# Patient Record
Sex: Male | Born: 1945 | Race: White | Hispanic: No | Marital: Married | Smoking: Former smoker
Health system: Southern US, Community
[De-identification: ages and names within clinical notes are randomized; demographics above are authoritative.]

## PROBLEM LIST (undated history)

## (undated) DIAGNOSIS — I10 Essential (primary) hypertension: Secondary | ICD-10-CM

## (undated) DIAGNOSIS — E669 Obesity, unspecified: Secondary | ICD-10-CM

## (undated) DIAGNOSIS — I2510 Atherosclerotic heart disease of native coronary artery without angina pectoris: Secondary | ICD-10-CM

## (undated) DIAGNOSIS — R7302 Impaired glucose tolerance (oral): Secondary | ICD-10-CM

## (undated) DIAGNOSIS — E785 Hyperlipidemia, unspecified: Secondary | ICD-10-CM

## (undated) HISTORY — PX: CARDIAC CATHETERIZATION: SHX172

## (undated) HISTORY — PX: OTHER SURGICAL HISTORY: SHX169

## (undated) HISTORY — DX: Obesity, unspecified: E66.9

## (undated) HISTORY — DX: Atherosclerotic heart disease of native coronary artery without angina pectoris: I25.10

## (undated) HISTORY — DX: Essential (primary) hypertension: I10

## (undated) HISTORY — DX: Impaired glucose tolerance (oral): R73.02

## (undated) HISTORY — DX: Hyperlipidemia, unspecified: E78.5

---

## 1997-08-24 ENCOUNTER — Ambulatory Visit (HOSPITAL_COMMUNITY): Admission: RE | Admit: 1997-08-24 | Discharge: 1997-08-24 | Payer: Self-pay | Admitting: Gastroenterology

## 1997-12-29 ENCOUNTER — Inpatient Hospital Stay (HOSPITAL_COMMUNITY): Admission: EM | Admit: 1997-12-29 | Discharge: 1997-12-31 | Payer: Self-pay | Admitting: Emergency Medicine

## 1998-01-10 ENCOUNTER — Inpatient Hospital Stay (HOSPITAL_COMMUNITY): Admission: EM | Admit: 1998-01-10 | Discharge: 1998-01-11 | Payer: Self-pay | Admitting: *Deleted

## 1998-09-29 ENCOUNTER — Encounter: Admission: RE | Admit: 1998-09-29 | Discharge: 1998-10-29 | Payer: Self-pay | Admitting: Family Medicine

## 1999-03-19 ENCOUNTER — Emergency Department (HOSPITAL_COMMUNITY): Admission: EM | Admit: 1999-03-19 | Discharge: 1999-03-19 | Payer: Self-pay | Admitting: Emergency Medicine

## 1999-10-26 ENCOUNTER — Encounter: Payer: Self-pay | Admitting: Emergency Medicine

## 1999-10-27 ENCOUNTER — Observation Stay (HOSPITAL_COMMUNITY): Admission: EM | Admit: 1999-10-27 | Discharge: 1999-10-27 | Payer: Self-pay | Admitting: Emergency Medicine

## 2000-12-25 ENCOUNTER — Inpatient Hospital Stay (HOSPITAL_COMMUNITY): Admission: EM | Admit: 2000-12-25 | Discharge: 2000-12-26 | Payer: Self-pay | Admitting: Podiatry

## 2000-12-25 ENCOUNTER — Encounter: Payer: Self-pay | Admitting: *Deleted

## 2001-04-01 ENCOUNTER — Emergency Department (HOSPITAL_COMMUNITY): Admission: EM | Admit: 2001-04-01 | Discharge: 2001-04-02 | Payer: Self-pay | Admitting: Emergency Medicine

## 2001-04-02 ENCOUNTER — Encounter: Payer: Self-pay | Admitting: Emergency Medicine

## 2001-05-13 ENCOUNTER — Emergency Department (HOSPITAL_COMMUNITY): Admission: EM | Admit: 2001-05-13 | Discharge: 2001-05-13 | Payer: Self-pay | Admitting: Family Medicine

## 2001-07-08 ENCOUNTER — Ambulatory Visit (HOSPITAL_COMMUNITY): Admission: RE | Admit: 2001-07-08 | Discharge: 2001-07-08 | Payer: Self-pay | Admitting: Orthopedic Surgery

## 2001-11-05 ENCOUNTER — Encounter: Admission: RE | Admit: 2001-11-05 | Discharge: 2002-02-03 | Payer: Self-pay | Admitting: Orthopedic Surgery

## 2002-08-27 ENCOUNTER — Encounter: Payer: Self-pay | Admitting: Orthopedic Surgery

## 2002-09-03 ENCOUNTER — Inpatient Hospital Stay (HOSPITAL_COMMUNITY): Admission: RE | Admit: 2002-09-03 | Discharge: 2002-09-04 | Payer: Self-pay | Admitting: Orthopedic Surgery

## 2002-09-03 ENCOUNTER — Encounter: Payer: Self-pay | Admitting: Orthopedic Surgery

## 2002-10-01 ENCOUNTER — Encounter: Payer: Self-pay | Admitting: Emergency Medicine

## 2002-10-01 ENCOUNTER — Emergency Department (HOSPITAL_COMMUNITY): Admission: EM | Admit: 2002-10-01 | Discharge: 2002-10-01 | Payer: Self-pay

## 2002-10-03 ENCOUNTER — Emergency Department (HOSPITAL_COMMUNITY): Admission: EM | Admit: 2002-10-03 | Discharge: 2002-10-03 | Payer: Self-pay | Admitting: Emergency Medicine

## 2002-10-03 ENCOUNTER — Encounter: Payer: Self-pay | Admitting: Emergency Medicine

## 2003-05-14 ENCOUNTER — Encounter: Admission: RE | Admit: 2003-05-14 | Discharge: 2003-05-14 | Payer: Self-pay | Admitting: Orthopedic Surgery

## 2003-07-24 ENCOUNTER — Inpatient Hospital Stay (HOSPITAL_COMMUNITY): Admission: RE | Admit: 2003-07-24 | Discharge: 2003-07-30 | Payer: Self-pay | Admitting: Orthopedic Surgery

## 2003-08-02 ENCOUNTER — Emergency Department (HOSPITAL_COMMUNITY): Admission: EM | Admit: 2003-08-02 | Discharge: 2003-08-03 | Payer: Self-pay | Admitting: Emergency Medicine

## 2003-08-23 ENCOUNTER — Emergency Department (HOSPITAL_COMMUNITY): Admission: EM | Admit: 2003-08-23 | Discharge: 2003-08-23 | Payer: Self-pay | Admitting: Emergency Medicine

## 2005-09-13 ENCOUNTER — Encounter: Payer: Self-pay | Admitting: *Deleted

## 2007-05-12 ENCOUNTER — Emergency Department (HOSPITAL_COMMUNITY): Admission: EM | Admit: 2007-05-12 | Discharge: 2007-05-12 | Payer: Self-pay | Admitting: Emergency Medicine

## 2007-10-07 ENCOUNTER — Emergency Department (HOSPITAL_COMMUNITY): Admission: EM | Admit: 2007-10-07 | Discharge: 2007-10-08 | Payer: Self-pay | Admitting: Emergency Medicine

## 2010-07-20 ENCOUNTER — Ambulatory Visit (INDEPENDENT_AMBULATORY_CARE_PROVIDER_SITE_OTHER): Payer: MEDICARE | Admitting: Nurse Practitioner

## 2010-07-20 DIAGNOSIS — E669 Obesity, unspecified: Secondary | ICD-10-CM

## 2010-07-20 DIAGNOSIS — I10 Essential (primary) hypertension: Secondary | ICD-10-CM

## 2010-07-20 DIAGNOSIS — I2510 Atherosclerotic heart disease of native coronary artery without angina pectoris: Secondary | ICD-10-CM

## 2010-09-30 NOTE — Op Note (Signed)
NAME:  Richard Reid, Richard Reid                         ACCOUNT NO.:  [id number]   MEDICAL RECORD NO.:  [id number]                   PATIENT TYPE:  INP   LOCATION:  B147                                 FACILITY:  [facility]   PHYSICIAN:  Marlowe Kays, M.D.               DATE OF BIRTH:  10-09-1945   DATE OF PROCEDURE:  DATE OF DISCHARGE:                                 OPERATIVE REPORT   PREOPERATIVE DIAGNOSIS:  Painful medial unicondylar arthroplasty, left knee.   POSTOPERATIVE DIAGNOSIS:  Painful unicondylar medial arthroplasty, left knee  secondary to loosening of femoral component.   OPERATION:  Revision of medial unicompartmental arthroplasty left knee to an  [employer] [place] posterior cruciate sacrificing totally cemented knee  replacement.   SURGEON:  Marlowe Kays, M.D.   ASSISTANT:  Almedia Balls. Ranell Patrick, M.D.   ANESTHESIA:  General.   INDICATIONS FOR PROCEDURE:  He had his original knee replacement performed  on September 03, 2002 and initially did quite well.  He has had progressive pain  in the inner aspect of his left knee with a negative workup with good-  looking x-rays.  No evidence for infection; however, in surgery, he was  found to have a loose femoral component.   PROCEDURE:  Prophylactic antibiotics, satisfactory general anesthesia, Foley  catheter inserted.  Pneumatic tourniquet.  Surefoot lateral hip positioner.  The left leg was prepped with Duraprep from tourniquet to ankle and draped  in a sterile field.  I extended the previous medial parapatellar incision  proximally and distally and with median and parapatellar incision, opened  the joint.  Checked the patellar mechanism.  Despite freeing it up as best  we could, it was quite tight.  I used the 45 degree quadriceps snip.  Even  with this, for the initial portion of the case, I just had to simply move  the patellar lateralward, but we were eventually able to ever the patella as  more bone was resected.  After  removing the ACL and most of the PCL, I found  the femoral component to be loose and easily removed it.  Then we removed  the tibial component as well without difficulty.  Then placed a 16 inch  drill hole in the distal femur by the canal finder, and the axis liner was  set for a 5 degree Valgus cut to the left knee.  I elected to remove 12 mm  of bone off the distal femur because it did have a slight flexion  contracture.  The distal femoral cut was made.  I then made a leveling cut  on the proximal tibia to allow room for the cradle for determining distal  femoral size to be placed.  The size was determined as a #9, scribe lines  were placed on the distal cut surface of the femur, and I then used the  distal femoral cutting jig, size 9, to make anterior and posterior  cuts and  posterior and anterior chamferings.  I then returned to the tibia, where a  base plate was placed, sizing it as a 9 and an initial intramedullary drill  hole was made followed by the canal finder, and the internal rub was  attached to the cutting jig for a 5 degree posterior cut, set for 2 mm of  the depressed medial tibial.  We lined this up with the external rods  splitting the bimalleolar distance.  A 2-mm cut was made.  I then used a  laminar spreader and removed remnants of bone and soft tissue posteriorly.  I placed a patellar guide for creating the patellar groove.  I followed this  with the cutter and punch for creating the hole for the post.  I used the  micro saw to remove the bone initially.  I then went through a trial  reduction and finally had adequate bone resection for a minimum of a 10 mm  spacer.  I used the tibial tray and external liner, splitting the  bimalleolar distance to mark the scribe lines on the anterior tibia.  With  knee in extension, I then measured the patella to a 26 and used the 10 mm  recessed cutting jig to make the initial cut, and I placed the guide for  making the three  fixation drill holes, which were placed.  The trial  patellar butt was then placed, and excess bone was trimmed up around the  patellar button.  I then returned to the tibia and placed the base plate in  a previously determined position, which was stabilized with three pins.  I  then used the tripod apparatus to ream for the tibial keel of 2 and 9.  With  the preparation completed, the wound was water-picked while the  methylmethacrylate was being mixed.  The three components were then  individually glued in and packing each one, removing excess  methylmethacrylate.  When the tibia and femur had been complete held in  extension while the patella was placed and held with a patellar clamp.  When  the methacrylate had hardened, the remnants of the methacrylate were trimmed  up from around the components and were checked posteriorly, in particular.  Then went through a number of  trial reductions, working up to a 15, which  gave [place] the best combination of 0 extension and flexion stability.  We did  have to perform a lateral release to insure a better patellar tracking.   The final size 15 mm posterior stabilized insert was then placed and range  of motion intact, incidentally checked and found to be good.  I then closed  the wound over a Hemovac.  At this point, it was two hours of tourniquet  time, and the tourniquet was released.  The quadriceps snip was prepared  with #1 fiber wire, and the regular quadriceps incision.  The __________  distally was closed in two layers with interrupted #1 Vicryl.  Subcutaneous  tissue was closed with a combination of #1 2-0 Vicryl and the skin with  staples.  Betadine Adaptic dry sterile dressing and knee immobilizer were  applied.  He tolerated the procedure well and was stable in satisfactory  condition with no other complications.  Estimated blood loss was less than  100 cc.  No blood replacement.  Marlowe Kays, M.D.    JA/MEDQ  D:  07/24/2003  T:  07/24/2003  Job:  [id number]

## 2010-09-30 NOTE — Cardiovascular Report (Signed)
[place]. [facility]  Patient:    Richard Reid, Richard Reid                        MRN: [id number] Proc. Date: 10/27/99 Attending:  Peter M. [place]il[place], M.D. CC:         Abran Cantor. Clovis Riley, MD             Cardiac Catheterization Laboratory                        Cardiac Catheterization  INDICATION FOR PROCEDURE:  The patient is a 65 year old male with a history of obesity and hypercholesterolemia who presents with chest pain consistent with previous anginal attack.  The patient is status post stenting in the mid LAD in August of 1999.  ACCESS:  Via the right femoral artery using the standard Seldinger technique.  EQUIPMENT:  A 6 French 4 cm right and left Judkins catheter, 6 French pigtail catheter, 6 French arterial sheath.  MEDICATIONS:  Local anesthesia with 1% Xylocaine.  CONTRAST:  Omnipaque 130 cc.  HEMODYNAMIC DATA:  Aortic pressures 131/88 with a mean of 107.  Left ventricular pressures 115 with an EDP of 23 mmHg.  ANGIOGRAPHIC DATA:  Left coronary artery:  The left coronary artery arises and distributes normally.  The patient has very large coronaries that are diffusely ectatic.  Left main:  The left main coronary artery is normal.  Left anterior descending:  The left anterior descending artery is a very large vessel.  There is a stent visible in the mid vessel which appears to have a mild 20-30% narrowing but no focal obstructive disease.  In the distal LAD as is wraps around the apex, there is a 40-50% narrowing.  There is a very small intermediate vessel which has an 80% stenosis proximally.  Left circumflex:  The left circumflex coronary artery is a large vessel without significant disease giving rise to two large marginal vessels.  Right coronary artery:  The right coronary artery is a large dominant vessel. There is approximately 20% disease in the PDA and posterolateral branches.  LEFT VENTRICULAR ANGIOGRAPHY:  The left ventricular angiography  is performed in the RAO view.  This demonstrates normal left ventricular chamber size and contractility with normal systolic function.  Ejection fraction is estimated at 60%.  There is no significant mitral valve prolapse or regurgitation.  FINAL INTERPRETATION: 1. Atherosclerotic coronary artery disease.    a. Obstructive lesion in a small intermediate vessel.  This vessel appears       too small to warrant intervention.    b. Continued patency of the previously stented site in the left anterior       descending artery. 2. Normal left ventricular function.  PLAN:  Will continue medical treatment and risk factor modification. DD:  10/27/99 TD:  11/01/99 Job: [id number]

## 2010-09-30 NOTE — Cardiovascular Report (Signed)
[place]. [facility]  Patient:    Richard Reid, Richard Reid                      MRN: [id number] Proc. Date: 12/25/00 Adm. Date:  [id number] Attending:  [place], Peter Manning CC:         Abran Cantor. Clovis Riley, M.D.   Cardiac Catheterization  INDICATIONS FOR PROCEDURE:  The patient is a 65 year old, white male with a history of hypercholesterolemia and prior stent to the mid LAD, who presents with unstable angina.  ACCESS:  Via the right femoral artery using standard Seldinger technique.  EQUIPMENT:  A 6 French 4 cm right and left Judkins catheter, 6 French pigtail catheter, 6 French arterial sheath.  MEDICATIONS:  Local anesthesia with 1% Xylocaine.  CONTRAST:  Omnipaque 190 cc.  HEMODYNAMIC DATA:  Aortic pressure is 118/82 with a mean of 100.  Left ventricular pressure is 112 with an EDP of 28 mmHg.  ANGIOGRAPHIC DATA:  Left coronary artery:  The left coronary artery arises and distributes normally.  Left main:  The left main coronary artery is large without significant disease.  Left anterior descending:  The left anterior descending artery is diffusely ectatic.  In the mid vessel the stent is noted and appears to be widely patent with less than 20% residual stenosis.  The distal LAD as it begins to wrap around the apex demonstrates a 95% focal stenosis.  There is a very small intermediate vessel which has a 95% stenosis at its origin.  Left circumflex:  The left circumflex coronary is a large vessel which has only minor wall irregularities, less than 20%.  Right coronary artery:  The right coronary artery is a large dominant vessel. It has minor irregularities in the distal vessel, less than 20%. It gives rise to a PDA and one large posterolateral branch.  LEFT VENTRICULAR ANGIOGRAPHY:  The left ventricular angiography performed in the RAO and LAO cranial views demonstrates normal left ventricular size and contractility with normal systolic function.   Ejection fraction is estimated at 65%.  No segmental wall motion abnormalities are seen.  IMPRESSION: 1. Two-vessel obstructive atherosclerotic coronary artery disease    involving a very small intermediate branch in the distal left anterior    descending. 2. Continued long-term patency of the prior stent in the mid left anterior    descending. 3. Normal left ventricular function.  PLAN:  The intermediate vessel appears to small for intervention.  I would recommend medical therapy at this point.  If the patient has refractory angina on medical therapy would consider angioplasty of the distal LAD. DD:  12/25/00 TD:  12/25/00 Job: [id number]

## 2010-09-30 NOTE — Op Note (Signed)
[facility]  Patient:    Richard Reid, Richard Reid Visit Number: [id number] MRN: [id number]          Service Type: DSU Location: DAY Attending Physician:  Marlowe Kays Page Dictated by:   Illene Labrador. Aplington, M.D. Proc. Date: 07/08/01 Admit Date:  07/08/2001                             Operative Report  PREOPERATIVE DIAGNOSES:  1. Torn medial meniscus.  2. Chondromalacia of the medial femoral condyle, left knee.  POSTOPERATIVE DIAGNOSES:  1. Torn medial and lateral menisci.  2. Medial compartment arthritis, left knee.  OPERATION PERFORMED:  Left knee arthroscopy with 1) partial, medial, and lateral meniscectomies, 2) shaving and debridement of the medial femoral condyle.  SURGEON:  Illene Labrador. Aplington, M.D.  ASSISTANT:  Nurse.  ANESTHESIA:  General.  PATHOLOGY AND JUSTIFICATION FOR PROCEDURE:  The problems began with a ______ on April 01, 2001. He has had persistent pain in his left knee leading to an MRI on June 12, 2001 which demonstrated a complex tear of the posterior horn of the medial meniscus especially laterally and some chondral defects in both the medial femoral condyle and the medial tibial plateau. This is basically confirmed at surgery. We also had intercondylar tear of the lateral meniscus as well.  DESCRIPTION OF PROCEDURE:  Satisfactory general anesthesia, pneumatic tourniquet, thigh stabilizer, left knee was prepped with Duraprep, draped in a sterile field, Ace wrap to right leg. First through an anterolateral portal, the medial compartment of the knee joint was evaluated. He had a good bit of synovitis in the knee joint which I resected with a 3.5 shaver. He had a little deformity of the anterior third of the medial meniscus which I also debrided up. Throughout extensive chondromalacia of the medial femoral condyle, I handled with a combination of debridement with baskets, smoothing down with rasps and ultimately shaving  down with a 3.5 shaver. The chondromalacia of the medial tibial plateau did not require any surgical treatment. He did have partial detachment of the medial meniscus on the underneath surface near the posterior attachment of the meniscus to the synovium involving basically the entire posterior third. Several representative pictures were taken. I managed this by resecting this back to stable rim with baskets and shaving until smooth with 3.5 shaver leaving a nice taper at the junction of the mid and posterior third. His ACL was intact. Looking up in the medial gutter and suprapatellar area, there was wear of his patella but nothing that required surgical intervention. I then reversed portals ______ he had a little bit of synovitis present. There was some irregularity to the inner border of the lateral meniscus but no frank tear except in the intercondylar area. There was a little bit of wear of the lateral tibial plateau. All of this pictured. I debrided back the intercondylar attachment of the lateral meniscus with baskets and then shaved it down until smooth with a 3.5 shaver. The knee joint was then irrigated until clear and all fluid possible removed. The two anterior portals were closed with 4-0 nylon, 20 cc of 0.5% Marcaine with adrenaline and 4 mg of morphine were then instilled through the inflow apparatus which was removed and this portal closed with 4-0 nylon as well. Betadine Adaptic dry sterile dressing were applied. The tourniquet was released. He tolerated the procedure well and at the time of this dictation was on his way  to the recovery room in satisfactory condition with no known complications. Dictated by:   Illene Labrador. Aplington, M.D. Attending Physician:  Joaquin Courts DD:  07/08/01 TD:  07/08/01 Job: [id number]

## 2010-09-30 NOTE — H&P (Signed)
[place]. [facility]  Patient:    Richard Reid, Richard Reid                      MRN: [id number] Adm. Date:  [id number] Attending:  [place], Peter Manning CC:         Abran Cantor. Clovis Riley, M.D.             Peter M. [place], M.D.                         History and Physical  HISTORY OF PRESENT ILLNESS:  This is a 65 year old Caucasian gentleman admitted with substernal chest pain.  He has a history of known coronary artery disease.  He presented in August 1999 with substernal pain similar to tonight and underwent cardiac catheterization and underwent stenting to a tight LAD lesion.  He was readmitted briefly a day or two later for observation and was felt to be musculoskeletal pain post catheterization.  He has had no subsequent problems until last night, while driving to work he felt nervous and had a feeling that his blood pressure might be up so that he turned around and came home and rested and took some aspirin.  Tonight while sitting in a chair, he developed substernal chest tightness without diaphoresis or nausea or vomiting but the pain was quite similar to what he experienced in 1999, so his wife drove him to the emergency room where he was evaluated and admitted. The patient admits to being very noncompliant with medications.  He has not been taking any beta blocker or aspirin. He has not been on any cholesterol medications.  He sees his physicians only infrequently.  He is not a known diabetic but does have a long history of labile blood pressure.  FAMILY HISTORY:  His father is living but has a history of heart trouble, diabetes and heart failure. Mother is living and has a history of high blood pressure.  SOCIAL HISTORY: Reveals that he works at a [employer] on the night shift. He runs a fork lift.  He is a nonsmoker but does dip snuff.  He denies any alcohol ingestion.  SOCIAL HISTORY: Reveals that he is married.  He has a 49-year-old child as well as  two grown daughters from a previous marriage. His present wife was 76 when they had their child.  PAST MEDICAL HISTORY:  Reveals that he has had rupture disc surgery by Dr. Jordan Likes for cervical disc disease.  He has also had a mass in the left breast removed about ten years ago which was benign.  ALLERGIES: No known drug allergies.  REVIEW OF SYSTEMS:  Gastrointestinal: Negative. Genitourinary:  Negative except for frequent nocturia.  He does not have any history of known diabetes or thyroid trouble.  PHYSICAL EXAMINATION:  VITAL SIGNS:  His blood pressure is 110/65, pulse 80 and regular, respirations normal. Weight is estimated at 283.  GENERAL:  Reveals that this is a pleasant bearded gentleman in no acute distress.  He has IV nitroglycerin and IV heparin running.  He is not having any chest pain at the present time.  CHEST: Clear to percussion and auscultation.  HEART: Reveals no murmur, gallop, rub or click.  ABDOMEN: Obese and nontender.  EXTREMITIES:  Show excellent pedal pulses.  Chest x-ray shows normal heart size and clear lungs.  EKG shows normal sinus rhythm and no acute changes. Laboratory studies are pending but the i-STAT is satisfactory.  DIAGNOSTIC IMPRESSION: 1. Chest pain rule out myocardial infarction, rule out unstable angina. 2. Known coronary artery disease status post stent to left anterior descending    in 1999. 3. Exogenous obesity.  DISPOSITION: We are going to treat him with IV nitroglycerin and IV heparin. Will add oral Lopressor and aspirin.  Serial enzymes will be obtained. Will also check fasting lipids.  Anticipate probable cardiac catheterization tomorrow by Dr. [place]and so we will keep npo until Dr. Swaziland sees him. DD:  10/27/99 TD:  10/27/99 Job: [id number]

## 2010-09-30 NOTE — Consult Note (Signed)
NAME:  Richard Reid, Richard Reid                         ACCOUNT NO.:  [id number]   MEDICAL RECORD NO.:  [id number]                   PATIENT TYPE:  INP   LOCATION:  0457                                 FACILITY:  [facility]   PHYSICIAN:  Rozanna Boer., M.D.      DATE OF BIRTH:  December 28, 1945   DATE OF CONSULTATION:  07/29/2003  DATE OF DISCHARGE:                                   CONSULTATION   REASON FOR CONSULTATION:  Inability to cath patient.   REFERRED BY:  Marlowe Kays, M.D.   BRIEF HISTORY:  This 65 year old patient had a left knee operation on July 24, 2003.  He has been unable to void since then and had no previous  difficulty voiding by history.  The catheter came out this morning and he  has voided very little on his own, if any, and has had two previous in and  out catheterizations but the third one attempted by the nurses this evening  was unsuccessful. They were unable to get into the bladder.  Urological  consultation was then requested.   The patient apparently is still on Flomax, Proscar and bethanechol.  He is  not very ambulatory yet from his knee. He was not particularly in discomfort  but has not voided all day.   The patient was helped back into his bed with some difficulty and with the  patient in the supine position, I was able to prep and drape the patient and  insert a #16 Foley catheter without difficulty.  He had about 300 mL of  clear urine, Foley was left to straight drainage.   IMPRESSION:  Urinary retention post left knee operation. Recommend continue  Flomax, Proscar and bethanechol as he is on and I would just leave the  catheter for a few more days until he is more ambulatory and then repeat  voiding trial at that time.  Dr. Logan Bores will continue to follow the patient.                                               Rozanna Boer., M.D.    HMK/MEDQ  D:  07/29/2003  T:  08/01/2003  Job:  [id number]

## 2010-09-30 NOTE — Op Note (Signed)
NAME:  Richard Reid, Richard Reid                         ACCOUNT NO.:  [id number]   MEDICAL RECORD NO.:  [id number]                   PATIENT TYPE:  INP   LOCATION:  0479                                 FACILITY:  [facility]   PHYSICIAN:  Marlowe Kays, M.D.               DATE OF BIRTH:  Mar 10, 1946   DATE OF PROCEDURE:  09/03/2002  DATE OF DISCHARGE:                                 OPERATIVE REPORT   PREOPERATIVE DIAGNOSIS:  Medial compartment degenerative and traumatic  arthritis, left knee.   POSTOPERATIVE DIAGNOSIS:  Medial compartment degenerative and traumatic  arthritis, left knee.   OPERATION:  1. Partial patellectomy, medial facet patella.  2. Osteonics medial uni-compartment arthroplasty, left knee.   SURGEON:  Marlowe Kays, M.D.   ASSISTANT:  Georges Lynch. Darrelyn Hillock, M.D.   ANESTHESIA:  General.   INDICATIONS FOR PROCEDURE:  He has genu varum bilaterally on the left knee.  He has had a torn medial meniscus followed by an arthroscopy and debridement  of medial joint with incomplete release of pain.  I have used the ______  supplementation and medication without substantial relief.  Accordingly, he  is here today for medial compartment arthroplasty.   PROCEDURE:  Prophylactic antibiotics, satisfactory general anesthesia, Foley  catheter inserted.  The femoral artery was located as a guide for the  femoral head and a large roll of tape was taped over this area.  A pneumatic  tourniquet applied via stabilizer.  The left knee was prepped with Duraprep,  draped in a sterile field.  A small median parapatellar incision was made  and going from roughly the superior pole of the patella down to just  adjacent to the tibial tubercle.  Incision was carried down through the  joint and the capsule opened somewhat more proximally.  Medial collateral  ligament was partially demarcated off the proximal medial tibia and with the  anterior fat pad partially removed, anterior partial medial  meniscus  removed, and soft tissue dissected off the medial facet of the patella, this  way I could then perform a patellectomy with oscillating saw.  I then marked  the ______ point on the femur with the knee in extension where the tibia  came to its most superior portion and also marked the inner condylar border  of the medial femoral condyle and medial tibia where we wanted to make our  medial sagittal cut in the tibial plateau.  I then placed the external guide  which I had fixed to the anterior pretibial area medially with two  stabilizing pins and used the exact position made by placing a feeler gauge  on the lower most portion of the medial tibial plateau.  I then used the  guide for making the sagittal cut and we first placed a block for a 4-mm  cup, made the sagittal cut and then the transverse cut removing small wedge  of the superior tibial plateau.  Initially, this looked like it was enough  bone resected based on using the flexion extension inserter to measure the  depth.  I then placed the template for marking out the femoral runner  utilizing external guide and rod based on the previously placed tape roll  over the femoral artery.  Once we were satisfied with the position of the  femoral component, this was stabilized with two pins and I then placed a  small drill hole for the trial prosthesis and a second larger hole just  above it for the peg in the final glued in prosthesis.  With this guide in  place, I then made my posterior cut on the femur and we removed this  template and placed the femoral cutting template using the burr to stop to  create the perimeter and then removing this template as well, and using a  combination of burr and rongeur to remove the center bone, I had to do a  little tailoring up placing the trial femoral component around the perimeter  of the burr to get a good fit on the femoral component.  But then it was  clear that there just was not enough  room for the tibial spacer.  This was  after removing the remnant of the medial meniscus.  Accordingly, we went  back and placed a tibial cutting jig and initially took 2 more mm off and  finally had to take an additional 2 for a total of 8 mm of bone resection.  I then sized the tibial component as a small and used the external rod which  was just medially to the great toe to place the tibial template on the  tibia.  We then stabilized the two pins and then placed two drill holes  followed by impaction-type chisel to create the keel for the tibial  component.  We then used the actual polyethelene tibial component to be sure  that it fit well on the tibia.  Trial reduction components were performed  and this gave excellent motion and good medial and lateral stability.  We  then thoroughly irrigated the knee and injected 0.25% Marcaine with  adrenaline in the soft tissues with care not to inject the posterior  capsule.  Rolled up saline sponges were placed in the suprapatellar pouch  medially and posteriorly prior to inserting the methylmethacrylate which by  now was being mixed.  When it was at the proper consistency, the tibia  component was first impacted tightly followed by the femoral component and  we then held the knee in extension and then switched to flexion removing the  excess methylmethacrylate.  When the methacrylate had hardened, we checked  carefully in the back after removing the sponges posteriorly from the  suprapatellar area and found small remnants of methylmethacrylate which were  removed.  The posterior compartment of the knee then appeared to be  completely free of methylmethacrylate.  We checked motion and stability  which appeared to be excellent.  At this point, we irrigated the wound with  sterile saline, placed a Hemovac, and began closure.  We then had two hours  of tourniquet time, we released the tourniquet.  The synovium was closed over the Hemovac with  interrupted 1 Vicryl, the capsule with interrupted 1  Vicryl, subcutaneous tissue with 2-0 Vicryl, and the skin with staples.  Betadine, Adaptic, and dry sterile dressings were applied followed by a knee  immobilizer.  He tolerated the procedure well and was taken to  the recovery  room in satisfactory condition with no known complications, no blood loss,  no blood replacement.                                               Marlowe Kays, M.D.    JA/MEDQ  D:  09/03/2002  T:  09/03/2002  Job:  [id number]

## 2010-09-30 NOTE — Discharge Summary (Signed)
[place]. [facility]  Patient:    Richard Reid, Richard Reid                      MRN: [id number] Adm. Date:  [id number] Disc. Date: [id number] Attending:  [place], Peter Manning CC:         Abran Cantor. Clovis Riley, M.D.                           Discharge Summary  HISTORY OF PRESENT ILLNESS:  Richard Reid is a 65 year old white male with history of coronary disease status post stenting of the LAD in August, 1999, who presented with prolonged chest pain experience.  For details of his past medical history, social history, family history, and physical examination, please see admission history and physical.  LABORATORY DATA:  Chemistries were normal.  CBC was normal.  CPK and troponins were negative x 2.  ECG showed normal sinus rhythm with nonspecific T wave abnormality.  HOSPITAL COURSE:  Patient was admitted and begun on IV nitroglycerin and heparin.  He subsequently ruled out for myocardial infarction.  He had no recurrent angina.  His cholesterol returned with a total cholesterol of 214, LDL of 133, HDL of 34, and triglycerides of 233.  Patient was also started on aspirin and beta blocker.  He underwent cardiac catheterization the same day. This demonstrated continued patency of the mid LAD at the previously stented site.  He had approximately 80% stenosis in a very small intermediate vessel. This was really unchanged from 1999.  No other obstructive disease was noted. He had normal left ventricular function.  It was recommended that patient be treated with aspirin, beta blocker, and a statin drug therapy.  He was discharged home the same day for post catheterization.  It was recommended that he follow up with his primary physician, Dr. Clovis Riley, for monitoring of his therapy.  DISCHARGE DIAGNOSES: 1. Angina pectoris. 2. Previous stenting of the mid LAD patent. 3. Dyslipidemia. 4. Exogenous obesity.  DISCHARGE MEDICATIONS: 1. Coated aspirin daily. 2. Toprol XL 25 mg  daily. 3. Zocor 20 mg per day.  DISCHARGE STATUS:  Improved. DD:  10/27/99 TD:  11/01/99 Job: [id number]

## 2010-09-30 NOTE — H&P (Signed)
NAME:  HARRIET, BOLLEN                         ACCOUNT NO.:  [id number]   MEDICAL RECORD NO.:  [id number]                   PATIENT TYPE:  INP   LOCATION:  NA                                   FACILITY:  [facility]   PHYSICIAN:  Marlowe Kays, M.D.               DATE OF BIRTH:  Dec 22, 1945   DATE OF ADMISSION:  09/03/2002  DATE OF DISCHARGE:                                HISTORY & PHYSICAL   CHIEF COMPLAINT:  Pain in my left knee.   HISTORY OF PRESENT ILLNESS:  This 65 year old white male has been seen by  Marlowe Kays, M.D. for progressive problems concerning his left knee.  The patient suffered an injury on April 01, 2001 when he was pushing a  machine and heard a cracking sound in his left lateral knee.  He had been  seen by several physicians prior to being seen by Marlowe Kays, M.D. and  treated with conservative care including nonsteroidal anti-inflammatories,  rest, ice.  Eventually, the patient was seen by Marlowe Kays, M.D.  MRI  was done which showed meniscal damage into the knee.  Subsequently underwent  a left knee arthroscopic surgery on July 08, 2001.  During that  operation Marlowe Kays, M.D. performed a partial medial and lateral  meniscectomy and shaving and debridement of the medial femoral condyle.  Chondromalacia was seen from previous trauma as well as meniscal tears.  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  Occasionally knee locks up on him and he has a considerable  amount of pain and discomfort.  Essentially, he is unchanged as far as his  level of pain.  Overall, the patient has marked interference of his daily  activity due to his left knee pain, has difficulty with stairs, and has  night pain.  He now walks with a pronounced left leg limp.  He has also  developed atrophy of the left lower extremity.  X-rays have shown  arthritic  changes with narrowing of the medial compartment.  After much discussion and  consideration, we felt this patient would benefit with a unicompartmental  knee arthroplasty to the left medial compartment of his knee.   The patient has been cleared preoperatively by Peter M. [place], M.D. of  [facility] here in [place].   PAST MEDICAL HISTORY:  The patient currently has hypertension.  He has  coronary artery disease.  Has had congestive failure in the past.  He has  been told he had a heart murmur at the time of his stents placed in his  coronary arteries two to three years ago.   PAST SURGICAL HISTORY:  1. Carpal tunnel syndrome release both hands.  2. He had cervical disk surgery.  3. Mass removed from the left breast.  4. Left knee arthroscopy as  mentioned above.  5. Stents in his coronary arteries as mentioned above.   CURRENT MEDICATIONS:  1. Toprol 100 mg one daily.  2. Zocor 20 mg one daily.   ALLERGIES:  He has no medical allergies.   Dr. Clovis Riley is his family physician and Peter M. [place], M.D. is his  cardiologist.   FAMILY HISTORY:  Positive for heart disease in his mother and father as well  as diabetes in his father.  His uncle has diabetes as well.  Also, cancer in  the family.   SOCIAL HISTORY:  The patient is married.  He is a Museum/gallery exhibitions officer.  Has no  intake of alcohol or tobacco products.  Has three children.  His wife will  be caregiver after surgery.  He has eight stairs to get into the side of his  home and five in the front.   REVIEW OF SYSTEMS:  CNS:  No seizures, shoulder paralysis, numbness, or  double vision.  RESPIRATORY:  No productive cough.  No hemoptysis.  No  shortness of breath.  CARDIOVASCULAR:  He denies any chest pain, angina, nor  orthopnea.  GASTROINTESTINAL:  No nausea, vomiting, melena, or bloody stool.  GENITOURINARY:  No discharge, dysuria, hematuria.  MUSCULOSKELETAL:  Primarily in present illness  with his left knee.   PHYSICAL EXAMINATION:  GENERAL:  Alert, cooperative, friendly, somewhat  obese 65 year old white male who walks with a sided limp and an antalgic  gait to the left.  VITAL SIGNS:  Blood pressure 140/92, pulse 80 and regular, respirations 12.  HEENT:  Normocephalic.  PERLA.  EOM intact.  Oropharynx is clear.  CHEST:  Clear to auscultation.  No rhonchi.  No rales.  HEART:  Regular rate and rhythm.  No murmurs are heard.  ABDOMEN:  Obese, soft, nontender.  Liver, spleen not felt.  GENITALIA:  Not done.  Not pertinent to present illness.  RECTAL:  Not done.  Not pertinent to present illness.  EXTREMITIES:  The patient has crepitus with range of motion to the left  knee, mild varus deformity, and tenderness to the medial joint line.   ADMITTING DIAGNOSES:  1. Medial compartment arthritis of the left knee secondary to old trauma.  2. Hypertension.  3. Coronary artery disease.  4. Mild obesity.   PLAN:  The patient will undergo uniarthroplasty to left knee.  In all  probability it will be a 24-48 hour stay.  He will not need continuous  passive motion machine.  He, in all probability, will not need any home  therapy.  If he is slow to begin ambulation then we may have to have home  therapy for short time or outpatient therapy.  He will return to see Marlowe Kays, M.D. two weeks after the date of surgery.  His return to work  date is undetermined at this time but it may be around six to eight weeks  after the surgery.  Fortunately, with this type of surgery there is no  interruption of the large musculature of the left lower extremity.  Therefore, hopefully rehabilitation postoperatively will be brief.     Dooley L. Cherlynn June.                 Marlowe Kays, M.D.    DLU/MEDQ  D:  08/26/2002  T:  08/26/2002  Job:  [id number]

## 2010-09-30 NOTE — H&P (Signed)
NAME:  Richard Reid, Richard Reid                         ACCOUNT NO.:  [id number]   MEDICAL RECORD NO.:  [id number]                   PATIENT TYPE:  INP   LOCATION:  NA                                   FACILITY:  [facility]   PHYSICIAN:  Marlowe Kays, M.D.               DATE OF BIRTH:  December 10, 1945   DATE OF ADMISSION:  07/24/2003  DATE OF DISCHARGE:                                HISTORY & PHYSICAL   CHIEF COMPLAINT:  Pain and problems with my left knee.   HISTORY OF PRESENT ILLNESS:  This 65 year old, white male is seen by [place] for  continuing and progressive problems concerning pain in his left knee.  The  patient underwent a unicompartmental Osteonics total knee replacement  arthroplasty of the left knee in April last year.  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  It was felt that he had overuse of the  left knee.  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  Even out of work, he continued with his  discomfort.  After much consideration as well as deterioration of the left  knee, it was felt he would benefit with a total knee replacement  arthroplasty of the left knee and is scheduled for same.  He has been  cleared preoperatively by Dr. Peter [place], his cardiologist, as he has had  a stent in the past.  He has done well with that.  He also has  hypercholesterolemia.   CURRENT MEDICATIONS:  1. Zocor 20 mg 1 daily.  2. Toprol XL 100 mg 1 daily.   He has no medical allergies.  Dr. Clovis Riley is his regular family physician,  whom he has not seen in well over a year.   PAST MEDICAL HISTORY:  The patient has a history of renal calculi, the last  one being in June of last year.   PAST SURGERIES:  1. Cervical diskectomy by Dr. Jordan Likes in 1998.  2. Skin cancers removed in 1992.  3. A  mass under his left chest removed in 1993.  He has had arthroscopy to     the left knee as well as the hemiarthroplasty.  4. His stent was placed in his coronary artery in either 1996 or '97.   FAMILY HISTORY:  Positive for heart disease and diabetes.   SOCIAL HISTORY:  The patient is married.  He is employed in Designer, fashion/clothing.  He  has no intake of alcohol products and uses snuff.   REVIEW OF SYSTEMS:  CNS:  No seizure, stroke, paralysis, numbness, double-  vision.  RESPIRATORY:  No productive cough, no hemoptysis, no shortness of  breath.  CARDIOVASCULAR:  No chest pain, no angina, no orthopnea.  GASTROINTESTINAL:  No nausea, vomiting, melena, or bloody stool.  GENITOURINARY:  No discharge or hematuria.  MUSCULOSKELETAL:  Primarily in  present illness.   PHYSICAL EXAMINATION:  GENERAL:  Alert, cooperative, friendly, obese 57-year-  old white male.  VITAL SIGNS:  Blood pressure 150/82, pulse 72 regular, respirations 12,  unlabored.  HEENT:  Normocephalic.  PERRLA.  EOM intact.  Oropharynx is clear.  CHEST:  Clear to auscultation.  No rhonchi or rales.  HEART:  Regular rate and rhythm.  No murmurs are heard.  ABDOMEN:  Obese, soft, nontender.  Liver/spleen not felt.  GENITALIA/RECTAL:  Not done.  Not pertinent for present illness.  EXTREMITIES:  The patient has crepitus with range of motion of his left  knee, is tender to palpation over the patellar tendon.  No gross joint  effusion.   ADMISSION DIAGNOSES:  1. Painful unicondylar arthroplasty, left knee.  2. Coronary artery disease.  3. Hypercholesterolemia.   PLAN:  The patient will be admitted for revision to total knee replacement  and arthroplasty of the hemiarthroplasty of the left knee.  Due to insurance  coverage, he will have to have outpatient physical therapy.  We will check  his lipid levels while in the hospital.  In all probability, he will have to  have Coumadin protocol after surgery.  He was given Lovenox or Arixtra  after  his last surgery.  We may be able to create a therapeutic level while he is  in the hospital utilizing Lovenox or Arixtra but in all probability, he will  need to be on Coumadin protocol.  We will ask Dr. [place] if he will help [place]  along with the dosing of Coumadin for about 4 weeks after surgery.     Dooley L. Cherlynn June.                 Marlowe Kays, M.D.    DLU/MEDQ  D:  07/16/2003  T:  07/16/2003  Job:  [id number]   cc:   Peter M. [place], M.D.  [address], [address]  [place], [place] [address]  Fax: [phone]

## 2010-09-30 NOTE — H&P (Signed)
[place]. [facility]  Patient:    Richard Reid, Richard Reid                      MRN: [id number] Adm. Date:  [id number] Attending:  [place], Peter Manning CC:         Abran Cantor. Clovis Riley, M.D.  Peter M. [place], M.D.   History and Physical  CHIEF COMPLAINT:  Chest pain.  HISTORY:  This is a 65 year old married Caucasian gentleman with known coronary artery disease, who is admitted through the emergency room with chest pain.  Patient had his first manifestation of coronary artery disease in 1999 when he presented with severe chest pain and underwent stenting to a tight LAD lesion.  Patient was readmitted in June of 2001 with chest pain and underwent a repeat cardiac catheterization by Dr. Peter M. [place].  The catheterization showed continued patency of the mid-LAD at the previously stented site; he also had an approximately 80% stenosis in a very small intermediate vessel, which was unchanged from 1999, and no other obstructive disease was noted.  He had normal left ventricular function.  Patient was discharged to follow up with his primary physician, Dr. Melvyn Neth D. Clovis Riley, and he has remained on aspirin, beta blocker and statin drug therapy.  At the time of that admission, his LDL was 133, cholesterol 214, HDL 34 and triglycerides at 233.  The patient had been essentially stable in terms of his heart until four days ago when he began having aching discomfort intermittently in the substernal area. It did not appear to be worse with exertion or any particular position. Tonight at work, the pain became worse and he came to the emergency room.  He was given a total of three nitroglycerin with final relief of pain.  At work, he had diaphoresis and felt dizzy but no nausea and vomiting.  He did note some left arm radiation to the pain.  PRESENT MEDICATIONS:  Zocor, uncertain dose; Toprol-XL, uncertain dose; aspirin, regular strength, not enteric coated, one daily.  FAMILY  HISTORY:  Positive for coronary disease and hypertension and diabetes.  SOCIAL HISTORY:  He works on the night shift at a Education officer, environmental.  He states that it is a strenuous job and involves a lot of "pulling and straining."  The patient is married.  He has a 19-year-old child at home and he has two grown daughters living away.  He does not smoke but he does dip snuff.  He does not drink any alcohol.  ALLERGIES:  No known drug allergies.  PAST MEDICAL HISTORY:  He has had cervical disk disease with previous surgery by Dr. Julio Sicks.  He also had a mass in the left breast removed 10 years ago which was benign.  REVIEW OF SYSTEMS:  GASTROINTESTINAL:  No history of bleeding ulcer.  No recent gastrointestinal symptoms.  GENITOURINARY:  Negative.  ENDOCRINE: Patient does not have any history of diabetes or thyroid trouble.  Remainder of review of systems is negative in detail.  PHYSICAL EXAMINATION:  VITAL SIGNS:  Blood pressure is 121/70.  Pulse is 68.  Respirations are normal.  HEENT:  Negative.  NECK:  Carotids negative.  CHEST:  Clear.  HEART:  Normal first sound.  Normal second sound.  There is no murmur, gallop or rub.  ABDOMEN:  Soft and nontender.  EXTREMITIES:  Good peripheral pulses.  No phlebitis or edema.  NEUROLOGIC:  Exam physiologic.  LABORATORY DATA:  His electrocardiogram shows normal sinus  rhythm, no acute change.  Laboratory studies show a CK-MB of 3.3, troponin less than 0.01.  Chest x-ray is no active disease.  IMPRESSION: 1. Chest pain, rule out myocardial infarction. 2. Coronary atherosclerotic heart disease, status post stent to proximal left    anterior descending, 1999, with repeat catheterization October 15, 1999 showing    continued patency of the stent. 3. Exogenous obesity with estimated weight of 285 pounds. 4. Hypercholesterolemia.  DISPOSITION:  Admit.  Treat with IV heparin.  Use sublingual nitroglycerin at this point.  Continue with  aspirin and beta blocker.  Further workup as per Dr. Peter [place]. DD:  12/25/00 TD:  12/25/00 Job: [id number]

## 2010-09-30 NOTE — Discharge Summary (Signed)
NAME:  Richard Reid, Richard Reid                         ACCOUNT NO.:  [id number]   MEDICAL RECORD NO.:  [id number]                   PATIENT TYPE:  INP   LOCATION:  0457                                 FACILITY:  [facility]   PHYSICIAN:  Marlowe Kays, M.D.               DATE OF BIRTH:  1945-11-28   DATE OF ADMISSION:  07/24/2003  DATE OF DISCHARGE:  07/30/2003                                 DISCHARGE SUMMARY   ADMISSION DIAGNOSES:  1. 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  2. Coronary artery disease.  3. Hypercholesterolemia.   DISCHARGE DIAGNOSES:  1. 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  2. Coronary artery disease.  3. Hypercholesterolemia.  4. Postoperative urinary retention.  5. Mild postoperative anemia.  6. Postoperative constipation.   OPERATION:  On July 24, 2003 the patient underwent revision of medial  unicompartmental arthroplasty of the left knee to [facility]  [place]  posterior cruciate sacrificing totally cemented knee replacement, Dr. Beverely Low assisted.   CONSULTATIONS:  Dr. Logan Bores, urology.   BRIEF HISTORY AND PHYSICAL:  This 65 year old male had a unicompartmental  arthroplasty in April of last year. He did well after surgery, but when he  returned to work, he began having increasing problems into the knee.  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  He was very disappointed that he could not continue work even though  he gave the full effort.  It was highly suspicioned there was loosening of  the unicompartmental arthroplasty.  He was cleared preoperatively by Dr.  Peter Swaziland and we went ahead with the above procedure.   HOSPITAL COURSE:  As this was a posterior cruciate sacrificing prosthesis as  well the fact that there had to be some ligament release to accommodate the  total knee, we kept the CPM in flexion of only 30 degrees.  We allowed full  extension.  The patient as far  as his left knee was concerned, the patient  did very well postoperatively.  He tolerated the CPM machine, tolerated  transfers and post total knee protocol.   Unfortunately, the patient had profound urinary retention postoperatively.  We tried to wean him from Foley catheter but, unfortunately, he eventually  had to have urological consult by Dr. Logan Bores.  Various medications were tried  both pre-consult which did not assist in him voiding.  Dr. Logan Bores saw the  patient, added medications to the regimen to assist him in voiding.  He  thought probably it was secondary to benign prostatic hypertrophy.  The  patient had to be discharged with Foley catheter and leg bag.  He will  follow up with Dr. Logan Bores.   We also had difficulty with the patient having constipation postoperatively  thought secondary to the anesthetics and analgesics.  We put him on ileus  protocol.  Fortunately, his  KUB x-ray did not show ileus, merely gas levels.  Eventually, the patient was able to have a bowel movement on the evening  prior to discharge.   As far as his left knee was concerned, the cap remained soft,  neurovascularly intact in the left lower extremity and the wound remained  dry.  Only mild amount of erythema.  We did allow weight bearing as  tolerated.   Dr. Logan Bores saw the patient on the morning of surgery and he, in all  probability, will have to have cystoscopy.  This will be done in the supine  position and Dr. Logan Bores states that there will be no stress on the left knee.   LABORATORY DATA:  Hematologically showed a CBC on admission which was within  normal limits.  Hemoglobin dropped down to a final of 9.3 with hematocrit of  27.6.  RBC was 2.93.  Blood chemistries remained essentially normal.  He had  some mild hypokalemia noted at 3.1.  Urinalysis was negative x2 both  preoperatively and postoperatively.  The cultures done in surgery showed no  growth x2 days and no anaerobes were isolated.  The  KUB, as mentioned above,  showed mild gaseous distention of the colon without evidence of obstruction.  Total knee postoperative x-rays showed expected appearing  status of post  left total knee replacement.  The chest x-ray showed mild tortuosity of the  thoracic aorta with thoracic spondylosis.  Electrocardiogram showed normal  sinus rhythm, nonspecific ST and T wave abnormality.   CONDITION ON DISCHARGE:  Improved and stable.   PLAN:  The patient is discharged to his home.  He is to continue with home  health via [place].  We will see him in our office approximately two weeks  and the patient is to call Dr. Logan Bores at [phone] for an appointment also to  be seen about two weeks.  Dr. Logan Bores placed the patient on Flomax 0.4 mg  (#30) 1 p.o. q.h.s., Avodart 0.5 mg (#30) 1 p.o. daily, Urecholine 50 mg  (#120) 1 q.6h. to begin before he comes to see Dr. Logan Bores, Cipro 500 mg (#10)  1 p.o. b.i.d.  He is to start the day before he comes to see Dr. Logan Bores with  that medication too.  We wrote him a prescription for Percocet 5/325 (#50) 1-  2 q.4-6h. p.r.n.  pain, Robaxin 500 mg (#30) 1 q.6h. p.r.n.  muscle spasm  and Coumadin to be filled out by pharmacy.  He had Lovenox in the hospital  but he is to continue on the Coumadin protocol after he leaves the hospital  and no Lovenox needed.  He is to follow with Dr. Peter Swaziland should he have  any cardiac or other internal medicine complaints.   The patient tells me that he has had difficulty getting his medications  filled due to the fact that he has little to no money while he is out of  work.  He has had to borrow money to get prescriptions filled.  We worked  with Jillene Bucks the nurse in charge of discharge case management, and the patient  is to call [employer] program and/or have the pharmacy call  the [employer] program at [phone] and they will pay for the medications.  I told the patient to call us should he  have any problems.      Dooley L. Underwood, P.A.  Marlowe Kays, M.D.    DLU/MEDQ  D:  07/30/2003  T:  08/02/2003  Job:  [id number]   cc:   Jamison Neighbor, M.D.  [address]  [place]  [place] [address]  Fax: [phone]   Peter M. Swaziland, M.D.  [address]  [place], [place] [address]  Fax: [phone]

## 2011-02-16 ENCOUNTER — Other Ambulatory Visit: Payer: Self-pay | Admitting: *Deleted

## 2011-02-16 MED ORDER — METOPROLOL SUCCINATE ER 50 MG PO TB24: 50.0000 mg | ORAL_TABLET | Freq: Every day | ORAL | Status: DC

## 2011-02-16 MED ORDER — SIMVASTATIN 80 MG PO TABS: 80.0000 mg | ORAL_TABLET | Freq: Every day | ORAL | Status: DC

## 2011-02-16 NOTE — Telephone Encounter (Signed)
Came by office stating the pharmacy had sent refill request 2 times. Explained to him that we have moved and there may have been problem w/ fax. Sent refills in to [facility] on [address].

## 2011-02-20 ENCOUNTER — Other Ambulatory Visit: Payer: Self-pay | Admitting: *Deleted

## 2011-08-15 ENCOUNTER — Encounter: Payer: Self-pay | Admitting: *Deleted

## 2011-09-11 ENCOUNTER — Other Ambulatory Visit: Payer: Self-pay | Admitting: Cardiology

## 2011-09-11 ENCOUNTER — Other Ambulatory Visit: Payer: Self-pay

## 2011-09-11 MED ORDER — METOPROLOL SUCCINATE ER 50 MG PO TB24: 50.0000 mg | ORAL_TABLET | Freq: Every day | ORAL | Status: DC

## 2011-09-11 MED ORDER — SIMVASTATIN 80 MG PO TABS
80.0000 mg | ORAL_TABLET | Freq: Every day | ORAL | Status: DC
Start: 2011-09-11 — End: 2011-12-01

## 2011-09-11 NOTE — Telephone Encounter (Signed)
Pt said out of pills. He has appt [phone]

## 2011-10-25 ENCOUNTER — Ambulatory Visit: Payer: Medicare Other | Admitting: Cardiology

## 2011-11-01 ENCOUNTER — Ambulatory Visit: Payer: Medicare Other | Admitting: Cardiology

## 2011-12-01 ENCOUNTER — Telehealth: Payer: Self-pay

## 2011-12-01 MED ORDER — SIMVASTATIN 80 MG PO TABS: 80.0000 mg | ORAL_TABLET | Freq: Every day | ORAL | Status: DC

## 2011-12-01 NOTE — Telephone Encounter (Signed)
Patient came to office missed appointment with Dr.Jordan 11/02/11.Caryn Bee scheduling appointment for patient to see Thomasenia Bottoms PA next week.Patient stated he needed refill on simvastatin.Simvastatin 80 mg sent to [facility].

## 2011-12-06 ENCOUNTER — Ambulatory Visit (INDEPENDENT_AMBULATORY_CARE_PROVIDER_SITE_OTHER): Payer: Medicare Other | Admitting: Physician Assistant

## 2011-12-06 ENCOUNTER — Encounter: Payer: Self-pay | Admitting: Physician Assistant

## 2011-12-06 VITALS — BP 134/84 | HR 81 | Ht 69.5 in | Wt 282.0 lb

## 2011-12-06 DIAGNOSIS — R7309 Other abnormal glucose: Secondary | ICD-10-CM

## 2011-12-06 DIAGNOSIS — R0602 Shortness of breath: Secondary | ICD-10-CM

## 2011-12-06 DIAGNOSIS — I10 Essential (primary) hypertension: Secondary | ICD-10-CM

## 2011-12-06 DIAGNOSIS — E785 Hyperlipidemia, unspecified: Secondary | ICD-10-CM

## 2011-12-06 DIAGNOSIS — I2510 Atherosclerotic heart disease of native coronary artery without angina pectoris: Secondary | ICD-10-CM | POA: Insufficient documentation

## 2011-12-06 DIAGNOSIS — R7302 Impaired glucose tolerance (oral): Secondary | ICD-10-CM

## 2011-12-06 MED ORDER — SIMVASTATIN 80 MG PO TABS: 80.0000 mg | ORAL_TABLET | Freq: Every day | ORAL | Status: DC

## 2011-12-06 MED ORDER — NITROGLYCERIN 0.4 MG SL SUBL: 0.4000 mg | SUBLINGUAL_TABLET | SUBLINGUAL | Status: DC | PRN

## 2011-12-06 MED ORDER — METOPROLOL SUCCINATE ER 50 MG PO TB24: 50.0000 mg | ORAL_TABLET | Freq: Every day | ORAL | Status: DC

## 2011-12-06 NOTE — Progress Notes (Signed)
[address] [place], [place]  [address] Phone: [phone] Fax:  [phone]  Date:  12/06/2011   Name:  Richard Reid   DOB:  1946-03-31   MRN:  [id number]  PCP:  Benita Stabile, MD  Primary Cardiologist:  Dr. Peter Swaziland  Primary Electrophysiologist:  None    History of Present Illness: Richard Reid is a 66 y.o. male who returns for follow up.  He is followed by Dr. Swaziland.  He has not been seen since 2009.  He has a h/o CAD, s/p prior stent to the mLAD, HTN, HL, hyperglycemia, DDD, DJD, obesity.  LHC 12/2000:  mLAD stent ok, dLAD 95% at apex, very small intermediate 95% at origin, CFX < 20%, dRCA < 20%, EF 65%.  Med Rx continued.  Since last being seen, he denies any chest discomfort.  He remains active.  He has noted increased DOE over the last several mos.  Probably describes Class IIb symtpoms.  No orthopnea, PND, edema.  No syncope.  He is somewhat limited in his activity by his right knee, s/p partial knee arthroplasty in the past.    Past Medical History  Diagnosis Date  . Hypertension   . Hyperlipidemia   . Coronary artery disease     LHC 12/2000:  mLAD stent ok, dLAD 95% at apex, very small intermediate 95% at origin, CFX < 20%, dRCA < 20%, EF 65%.  -  med Rx  . Obesity   . Glucose intolerance (impaired glucose tolerance)     Current Outpatient Prescriptions  Medication Sig Dispense Refill  . aspirin 81 MG tablet Take 81 mg by mouth daily.      . metoprolol succinate (TOPROL XL) 50 MG 24 hr tablet Take 1 tablet (50 mg total) by mouth daily.  30 tablet  1  . simvastatin (ZOCOR) 80 MG tablet Take 1 tablet (80 mg total) by mouth at bedtime.  30 tablet  0  . nitroGLYCERIN (NITROSTAT) 0.4 MG SL tablet Place 0.4 mg under the tongue every 5 (five) minutes as needed. ( not taking )        Allergies: Allergies  Allergen Reactions  . Morphine And Related     History  Substance Use Topics  . Smoking status: Current Everyday Smoker --  0.5 packs/day for 4 years    Types: Cigarettes  . Smokeless tobacco: Current User    Types: Snuff, Chew  . Alcohol Use: No     ROS:  Please see the history of present illness.    All other systems reviewed and negative.   PHYSICAL EXAM: VS:  BP 134/84  Pulse 81  Ht 5' 9.5" (1.765 m)  Wt 282 lb (127.914 kg)  BMI 41.05 kg/m2 Well nourished, well developed, in no acute distress HEENT: normal Neck: no JVD Vascular: no carotid bruits Cardiac:  normal S1, S2; RRR; no murmur Lungs:  Decreased breath sounds bilaterally, no wheezing, rhonchi or rales Abd: soft, nontender, no hepatomegaly Ext: no edema Skin: warm and dry Neuro:  CNs 2-12 intact, no focal abnormalities noted  EKG:  NSR, HR 85, non-specific ST-T changes      ASSESSMENT AND PLAN:  1.  Dyspnea with Exertion Suspect related to deconditioning and obesity.  But, no ischemic workup in many years.   He cannot walk on a treadmill due to his right knee issues. Arrange [employer]  2.  CAD Arrange myoview as noted. Continue asa, beta blocker and statin. Follow up with  Dr. Peter Swaziland in 3 mos or sooner if myoview abnormal.  3.  Hypertension  Controlled.  Continue current therapy.   4.  Hyperlipidemia Check L/L at stress test.  5.  Glucose intolerance His last A1C was 6.8 3 years ago. He has not follow up with his PCP. I have asked him to follow up as his A1C suggest diabetes. Will repeat his A1C with his labs.   Luna Glasgow, PA-C  10:29 AM 12/06/2011

## 2011-12-06 NOTE — Patient Instructions (Addendum)
Your physician recommends that you schedule a follow-up appointment in: 3 MONTHS WITH DR. Swaziland  PLEASE MAKE A FOLLOW UP APPT WITH YOUR PRIMARY CARE PHYSICIAN PER SCOTT [facility]ER, PAC TO FOLLOW UP FOR YOUR ELEVATED BLOOD SUGAR LEVELS  Your physician recommends that you return for lab work in: TODAY BMET, LFT, LIPID THESE CAN BE DONE THE SAME DAY YOU COME IN FOR YOUR STRESS TEST   Your physician has requested that you have a lexiscan myoview DX CHEST PAIN AND CAD. For further information please visit [email or web address] Please follow instruction sheet, as given.

## 2011-12-14 ENCOUNTER — Other Ambulatory Visit (INDEPENDENT_AMBULATORY_CARE_PROVIDER_SITE_OTHER): Payer: Medicare Other

## 2011-12-14 ENCOUNTER — Encounter: Payer: Self-pay | Admitting: Physician Assistant

## 2011-12-14 ENCOUNTER — Ambulatory Visit (HOSPITAL_COMMUNITY): Payer: Medicare Other | Attending: Internal Medicine | Admitting: Radiology

## 2011-12-14 VITALS — BP 121/84 | HR 66 | Ht 69.5 in | Wt 276.0 lb

## 2011-12-14 DIAGNOSIS — I10 Essential (primary) hypertension: Secondary | ICD-10-CM

## 2011-12-14 DIAGNOSIS — R0602 Shortness of breath: Secondary | ICD-10-CM | POA: Insufficient documentation

## 2011-12-14 DIAGNOSIS — I2510 Atherosclerotic heart disease of native coronary artery without angina pectoris: Secondary | ICD-10-CM

## 2011-12-14 DIAGNOSIS — R7309 Other abnormal glucose: Secondary | ICD-10-CM

## 2011-12-14 DIAGNOSIS — R079 Chest pain, unspecified: Secondary | ICD-10-CM

## 2011-12-14 LAB — LIPID PANEL
Cholesterol: 137 mg/dL (ref 0–200)
HDL: 40.7 mg/dL (ref >39.00)
LDL Cholesterol: 63 mg/dL (ref 0–99)
Total CHOL/HDL Ratio: 3
Triglycerides: 166 mg/dL — ABNORMAL HIGH (ref 0.0–149.0)

## 2011-12-14 LAB — BASIC METABOLIC PANEL
BUN: 11 mg/dL (ref 6–23)
CO2: 31 mEq/L (ref 19–32)
Calcium: 9.5 mg/dL (ref 8.4–10.5)
Chloride: 101 mEq/L (ref 96–112)
GFR: 106.31 mL/min (ref >60.00)
Glucose, Bld: 115 mg/dL — ABNORMAL HIGH (ref 70–99)
Potassium: 4.2 mEq/L (ref 3.5–5.1)
Sodium: 140 mEq/L (ref 135–145)

## 2011-12-14 LAB — HEMOGLOBIN A1C: Hgb A1c MFr Bld: 7.2 % — ABNORMAL HIGH (ref 4.6–6.5)

## 2011-12-14 MED ORDER — TECHNETIUM TC 99M TETROFOSMIN IV KIT
11.0000 | PACK | Freq: Once | INTRAVENOUS | Status: AC | PRN
  Administered 2011-12-14: 11 via INTRAVENOUS

## 2011-12-14 MED ORDER — REGADENOSON 0.4 MG/5ML IV SOLN
0.4000 mg | Freq: Once | INTRAVENOUS | Status: AC
  Administered 2011-12-14: 0.4 mg via INTRAVENOUS

## 2011-12-14 MED ORDER — TECHNETIUM TC 99M TETROFOSMIN IV KIT
33.0000 | PACK | Freq: Once | INTRAVENOUS | Status: AC | PRN
  Administered 2011-12-14: 33 via INTRAVENOUS

## 2011-12-14 NOTE — Progress Notes (Signed)
[facility] SITE 3 NUCLEAR MED [address] [place] [address] [phone]  Cardiology Nuclear Med Study  Richard Reid is a 66 y.o. male     MRN : [id number]     DOB: Aug 16, 1945  Procedure Date: 12/14/2011  Nuclear Med Background Indication for Stress Test:  Evaluation for Ischemia History:  '99 Stent-LAD; '02 Cath:Patent Stent with 2-vessel CAD, EF=65% (medical tx.) Cardiac Risk Factors: Family History - CAD, History of Smoking, Hypertension, Lipids, Obesity and Dips  Symptoms:  Chest Pain (last date of chest discomfort was about a week ago) and DOE   Nuclear Pre-Procedure Caffeine/Decaff Intake:  None NPO After: 10:00pm   Lungs:  Clear. O2 Sat: 95% on room air. IV 0.9% NS with Angio Cath:  20g  IV Site: L Antecubital  IV Started by:  Bonnita Levan, RN  Chest Size (in):  54 Cup Size: n/a  Height: 5' 9.5" (1.765 m)  Weight:  276 lb (125.193 kg)  BMI:  Body mass index is 40.17 kg/(m^2). Tech Comments:  Patient took Toprol this AM    Nuclear Med Study 1 or 2 day study: 1 day  Stress Test Type:  Lexiscan  Reading MD: Dietrich Pates, MD  Order Authorizing Provider:  Peter Swaziland, MD; Tereso Newcomer, PA-C  Resting Radionuclide: Technetium 63m Tetrofosmin  Resting Radionuclide Dose: 11.0 mCi   Stress Radionuclide:  Technetium 69m Tetrofosmin  Stress Radionuclide Dose: 33.0 mCi           Stress Protocol Rest HR: 66 Stress HR: 81  Rest BP: 121/84 Stress BP: 133/91  Exercise Time (min): n/a METS: n/a   Predicted Max HR: 156 bpm % Max HR: 51.92 bpm Rate Pressure Product: 69629   Dose of Adenosine (mg):  n/a Dose of Lexiscan: 0.4 mg  Dose of Atropine (mg): n/a Dose of Dobutamine: n/a mcg/kg/min (at max HR)  Stress Test Technologist: Smiley Houseman, CMA-N  Nuclear Technologist:  Domenic Polite, CNMT     Rest Procedure:  Myocardial perfusion imaging was performed at rest 45 minutes following the intravenous administration of Technetium 38m  Tetrofosmin.  Rest ECG: No acute changes.  Stress Procedure:  The patient received IV Lexiscan 0.4 mg over 15-seconds.  Technetium 65m Tetrofosmin injected at 30-seconds.  There were nonspecific T-wave changes and a rare PVC in recovery.  Quantitative spect images were obtained after a 45 minute delay.  Stress ECG: No significant change from baseline ECG  QPS Raw Data Images:  SOft tissue (diaphragm) underlies the heart. Stress Images:  Normal homogeneous uptake in all areas of the myocardium. Rest Images:  Normal homogeneous uptake in all areas of the myocardium. Subtraction (SDS):  No evidence of ischemia. Transient Ischemic Dilatation (Normal <1.22):  1.06 Lung/Heart Ratio (Normal <0.45):  0.36  Quantitative Gated Spect Images QGS EDV:  94 ml QGS ESV:  35 ml  Impression Exercise Capacity:  Lexiscan with no exercise. BP Response:  Normal blood pressure response. Clinical Symptoms:  No chest pain. ECG Impression:  No significant ST segment change suggestive of ischemia. Comparison with Prior Nuclear Study: No previous nuclear study performed  Overall Impression:  Normal stress nuclear study.  LV Ejection Fraction: 63%.  LV Wall Motion:  NL LV Function; NL Wall Motion  Dietrich Pates

## 2012-03-06 ENCOUNTER — Ambulatory Visit: Payer: Medicare Other | Admitting: Cardiology

## 2012-12-15 ENCOUNTER — Other Ambulatory Visit: Payer: Self-pay | Admitting: Cardiology

## 2013-02-08 ENCOUNTER — Other Ambulatory Visit: Payer: Self-pay | Admitting: Physician Assistant

## 2013-02-10 ENCOUNTER — Other Ambulatory Visit: Payer: Self-pay

## 2013-02-10 MED ORDER — SIMVASTATIN 80 MG PO TABS: ORAL_TABLET | ORAL | Status: DC

## 2013-02-28 ENCOUNTER — Telehealth: Payer: Self-pay

## 2013-02-28 MED ORDER — SIMVASTATIN 20 MG PO TABS: 20.0000 mg | ORAL_TABLET | Freq: Every day | ORAL | Status: DC

## 2013-02-28 NOTE — Telephone Encounter (Signed)
Patient called spoke to wife Dr.Jordan received drug interaction alert advising potential interaction with amlodipine 10 mg and simvastatin 80 mg.Dr.Jordan advised to decrease simvastatin to 20 mg daily.Prescription sent to pharmacy.

## 2013-04-02 ENCOUNTER — Ambulatory Visit: Payer: Medicare Other | Admitting: Cardiology

## 2013-04-29 ENCOUNTER — Encounter: Payer: Self-pay | Admitting: Cardiology

## 2013-11-13 ENCOUNTER — Emergency Department (HOSPITAL_COMMUNITY): Payer: Medicare Other

## 2013-11-13 ENCOUNTER — Emergency Department (HOSPITAL_COMMUNITY)
Admission: EM | Admit: 2013-11-13 | Discharge: 2013-11-14 | Disposition: A | Discharge status: Home / Self Care | Payer: Medicare Other | Attending: Emergency Medicine | Admitting: Emergency Medicine

## 2013-11-13 ENCOUNTER — Encounter (HOSPITAL_COMMUNITY): Payer: Self-pay | Admitting: Emergency Medicine

## 2013-11-13 DIAGNOSIS — I2510 Atherosclerotic heart disease of native coronary artery without angina pectoris: Secondary | ICD-10-CM | POA: Insufficient documentation

## 2013-11-13 DIAGNOSIS — K029 Dental caries, unspecified: Secondary | ICD-10-CM | POA: Insufficient documentation

## 2013-11-13 DIAGNOSIS — E669 Obesity, unspecified: Secondary | ICD-10-CM | POA: Insufficient documentation

## 2013-11-13 DIAGNOSIS — K047 Periapical abscess without sinus: Secondary | ICD-10-CM | POA: Insufficient documentation

## 2013-11-13 DIAGNOSIS — Z79899 Other long term (current) drug therapy: Secondary | ICD-10-CM | POA: Insufficient documentation

## 2013-11-13 DIAGNOSIS — I10 Essential (primary) hypertension: Secondary | ICD-10-CM | POA: Insufficient documentation

## 2013-11-13 DIAGNOSIS — Z792 Long term (current) use of antibiotics: Secondary | ICD-10-CM | POA: Insufficient documentation

## 2013-11-13 DIAGNOSIS — F17200 Nicotine dependence, unspecified, uncomplicated: Secondary | ICD-10-CM | POA: Insufficient documentation

## 2013-11-13 DIAGNOSIS — Z9889 Other specified postprocedural states: Secondary | ICD-10-CM | POA: Insufficient documentation

## 2013-11-13 DIAGNOSIS — E785 Hyperlipidemia, unspecified: Secondary | ICD-10-CM | POA: Insufficient documentation

## 2013-11-13 LAB — CBC WITH DIFFERENTIAL/PLATELET
Basophils Absolute: 0 10*3/uL (ref 0.0–0.1)
Basophils Relative: 0 % (ref 0–1)
EOS ABS: 0.2 10*3/uL (ref 0.0–0.7)
Eosinophils Relative: 2 % (ref 0–5)
HEMATOCRIT: 46.4 % (ref 39.0–52.0)
HEMOGLOBIN: 15.7 g/dL (ref 13.0–17.0)
LYMPHS ABS: 2.8 10*3/uL (ref 0.7–4.0)
LYMPHS PCT: 21 % (ref 12–46)
MCH: 32.7 pg (ref 26.0–34.0)
MCHC: 33.8 g/dL (ref 30.0–36.0)
MCV: 96.7 fL (ref 78.0–100.0)
MONO ABS: 0.8 10*3/uL (ref 0.1–1.0)
Monocytes Relative: 6 % (ref 3–12)
Neutro Abs: 9.3 10*3/uL — ABNORMAL HIGH (ref 1.7–7.7)
Neutrophils Relative %: 71 % (ref 43–77)
PLATELETS: 215 10*3/uL (ref 150–400)
RBC: 4.8 MIL/uL (ref 4.22–5.81)
RDW: 12.9 % (ref 11.5–15.5)
WBC: 13.1 10*3/uL — AB (ref 4.0–10.5)

## 2013-11-13 LAB — BASIC METABOLIC PANEL
Anion gap: 13 (ref 5–15)
BUN: 20 mg/dL (ref 6–23)
CO2: 31 mEq/L (ref 19–32)
CREATININE: 0.96 mg/dL (ref 0.50–1.35)
Calcium: 10 mg/dL (ref 8.4–10.5)
Chloride: 94 mEq/L — ABNORMAL LOW (ref 96–112)
GFR calc Af Amer: >90 mL/min (ref >90)
GFR, EST NON AFRICAN AMERICAN: 84 mL/min — AB (ref >90)
GLUCOSE: 125 mg/dL — AB (ref 70–99)
POTASSIUM: 4.2 meq/L (ref 3.7–5.3)
SODIUM: 138 meq/L (ref 137–147)

## 2013-11-13 MED ORDER — IOHEXOL 300 MG/ML  SOLN
80.0000 mL | Freq: Once | INTRAMUSCULAR | Status: AC | PRN
  Administered 2013-11-13: 80 mL via INTRAVENOUS

## 2013-11-13 MED ORDER — PENICILLIN V POTASSIUM 250 MG PO TABS
500.0000 mg | ORAL_TABLET | Freq: Once | ORAL | Status: AC
  Administered 2013-11-13: 500 mg via ORAL
  Filled 2013-11-13: qty 2

## 2013-11-13 MED ORDER — PENICILLIN V POTASSIUM 500 MG PO TABS: 500.0000 mg | ORAL_TABLET | Freq: Four times a day (QID) | ORAL | Status: AC

## 2013-11-13 MED ORDER — KETOROLAC TROMETHAMINE 30 MG/ML IJ SOLN
30.0000 mg | Freq: Once | INTRAMUSCULAR | Status: AC
  Administered 2013-11-13: 30 mg via INTRAVENOUS
  Filled 2013-11-13: qty 1

## 2013-11-13 MED ORDER — SODIUM CHLORIDE 0.9 % IV BOLUS (SEPSIS)
1000.0000 mL | Freq: Once | INTRAVENOUS | Status: AC
  Administered 2013-11-13: 1000 mL via INTRAVENOUS

## 2013-11-13 NOTE — Discharge Instructions (Signed)
Abscessed Tooth An abscessed tooth is an infection around your tooth. It may be caused by holes or damage to the tooth (cavity) or a dental disease. An abscessed tooth causes mild to very bad pain in and around the tooth. See your dentist right away if you have tooth or gum pain. HOME CARE  Take your medicine as told. Finish it even if you start to feel better.  Do not drive after taking pain medicine.  Rinse your mouth (gargle) often with salt water ( teaspoon salt in 8 ounces of warm water).  Do not apply heat to the outside of your face. GET HELP RIGHT AWAY IF:   You have a temperature by mouth above 102 F (38.9 C), not controlled by medicine.  You have chills and a very bad headache.  You have problems breathing or swallowing.  Your mouth will not open.  You develop puffiness (swelling) on the neck or around the eye.  Your pain is not helped by medicine.  Your pain is getting worse instead of better. MAKE SURE YOU:   Understand these instructions.  Will watch your condition.  Will get help right away if you are not doing well or get worse. Document Released: 10/18/2007 Document Revised: 07/24/2011 Document Reviewed: 08/09/2010 ExitCare Patient Information 2015 [employer]. This information is not intended to replace advice given to you by your health care provider. Make sure you discuss any questions you have with your health care provider.  

## 2013-11-13 NOTE — ED Provider Notes (Signed)
CSN: [id number]     Arrival date & time 11/13/13  1714 History   First MD Initiated Contact with Patient 11/13/13 1856     Chief Complaint  Patient presents with  . Dental Problem     (Consider location/radiation/quality/duration/timing/severity/associated sxs/prior Treatment) Patient is a 68 y.o. male presenting with tooth pain.  Dental Pain Location:  Lower Lower teeth location:  24/LL central incisor, 25/RL central incisor, 23/LL lateral incisor, 26/RL lateral incisor, 27/RL cuspid and 22/LL cuspid Quality:  Aching and pressure-like Severity:  Severe Onset quality:  Sudden Duration:  2 weeks Timing:  Constant Progression:  Worsening Chronicity:  New Context: dental caries   Previous work-up:  Dental exam Relieved by:  Nothing Ineffective treatments:  NSAIDs Associated symptoms: facial pain   Associated symptoms: no congestion, no difficulty swallowing, no fever, no gum swelling, no headaches and no neck pain   Risk factors: chewing tobacco use      Past Medical History  Diagnosis Date  . Hypertension   . Hyperlipidemia   . Coronary artery disease     LHC 12/2000:  mLAD stent ok, dLAD 95% at apex, very small intermediate 95% at origin, CFX < 20%, dRCA < 20%, EF 65%.  -  med Rx  . Obesity   . Glucose intolerance (impaired glucose tolerance)    Past Surgical History  Procedure Laterality Date  . Cardiac catheterization    . Stent to  mid lad      Family History  Problem Relation Age of Onset  . Heart disease Father   . Heart failure Father    History  Substance Use Topics  . Smoking status: Current Every Day Smoker -- 0.50 packs/day for 4 years    Types: Cigarettes  . Smokeless tobacco: Current User    Types: Snuff, Chew  . Alcohol Use: No    Review of Systems  Constitutional: Negative for fever and chills.  HENT: Negative for congestion and sore throat.   Eyes: Negative for pain.  Respiratory: Negative for cough and shortness of breath.   Cardiovascular:  Negative for chest pain.  Gastrointestinal: Negative for nausea, vomiting and abdominal pain.  Genitourinary: Negative for dysuria and flank pain.  Musculoskeletal: Negative for back pain and neck pain.  Skin: Negative for rash.  Neurological: Negative for seizures and headaches.      Allergies  Morphine and related  Home Medications   Prior to Admission medications   Medication Sig Start Date End Date Taking? Authorizing Provider  amLODipine (NORVASC) 10 MG tablet Take 10 mg by mouth daily.   Yes Historical Provider, MD  aspirin-acetaminophen-caffeine (EXCEDRIN EXTRA STRENGTH) [phone] MG per tablet Take 1 tablet by mouth 3 (three) times daily as needed (pain).   Yes Historical Provider, MD  Benzocaine (ORAJEL MT) Use as directed 1 application in the mouth or throat as needed (tooth pain).   Yes Historical Provider, MD  hydrochlorothiazide (HYDRODIURIL) 25 MG tablet Take 25 mg by mouth daily.   Yes Historical Provider, MD  lisinopril (PRINIVIL,ZESTRIL) 20 MG tablet Take 20 mg by mouth daily.   Yes Historical Provider, MD  metoprolol succinate (TOPROL-XL) 100 MG 24 hr tablet Take 100 mg by mouth daily. Take with or immediately following a meal.   Yes Historical Provider, MD  nitroGLYCERIN (NITROSTAT) 0.4 MG SL tablet Place 0.4 mg under the tongue every 5 (five) minutes as needed for chest pain.   Yes Historical Provider, MD  simvastatin (ZOCOR) 20 MG tablet Take 1 tablet (20 mg  total) by mouth at bedtime. 02/28/13  Yes Peter M SwazilandJordan, MD  penicillin v potassium (VEETID) 500 MG tablet Take 1 tablet (500 mg total) by mouth 4 (four) times daily. 11/13/13 11/20/13  Imagene ShellerSteve Jossie Smoot, MD   BP 129/74  Pulse 58  Temp(Src) 98.1 F (36.7 C) (Oral)  Resp 20  Ht 5\' 9"  (1.753 m)  Wt 251 lb 6.4 oz (114.034 kg)  BMI 37.11 kg/m2  SpO2 93% Physical Exam  Constitutional: He is oriented to person, place, and time. He appears well-developed and well-nourished. No distress.  HENT:  Head: Normocephalic  and atraumatic.  Mouth/Throat: Abnormal dentition. Dental caries present. No dental abscesses.  Eyes: Pupils are equal, round, and reactive to light.  Neck: Normal range of motion.  Cardiovascular: Normal rate and regular rhythm.   Pulmonary/Chest: Effort normal and breath sounds normal.  Abdominal: Soft. He exhibits no distension. There is no tenderness.  Musculoskeletal: Normal range of motion.  Neurological: He is alert and oriented to person, place, and time.  Skin: Skin is warm. He is not diaphoretic.    ED Course  Procedures (including critical care time) Labs Review Labs Reviewed  CBC WITH DIFFERENTIAL - Abnormal; Notable for the following:    WBC 13.1 (*)    Neutro Abs 9.3 (*)    All other components within normal limits  BASIC METABOLIC PANEL - Abnormal; Notable for the following:    Chloride 94 (*)    Glucose, Bld 125 (*)    GFR calc non Af Amer 84 (*)    All other components within normal limits    Imaging Review No results found.   EKG Interpretation None      MDM   Final diagnoses:  Dental abscess   68 yo M hx of HTN, HLD, CAD presents with dental abscesses.   Patient with pain throughout lower jaw associated with dental pain. Likely abscess, but will obtain labs / CT scan to rule out Ludwig's angina, worsening infection.   CT scan demonstrates 28x9x15 mm subperiosteal ascess related to R lower lateral incisor endodontal disease. The abscess is on the outer surface of the mandible. No evidence of Ludwig's. Will treat with PCN as outpatient. Discussed with patient, who is comfortable with plan. Strict return precautions given. Discharged to home ins table condition with instructions to follow-up with dentistry. Patient seen and evaluated by myself and my attending, Dr. Preston FleetingGlick.      Imagene ShellerSteve Emmagene Ortner, MD 11/14/13 [id number]

## 2013-11-13 NOTE — ED Provider Notes (Signed)
68 year old male is complaining of pain in his 17th that he has on the lower run. He is completely edentulous in his uppers. Pain is severe. He went to his dentist he was worried that there is too much infection and referred him to the ED. On exam, there are 7 lower teeth present with significant pallor and erythema of the underlying gingiva. Teeth appear generally carious. However, there is no swelling of any of the intraoral tissues other than a gingiva and has no difficulty with secretions and phonation. He is being evaluated for possible more severe infection but anticipate sending him home on antibiotics with followup with his dentist.  I saw and evaluated the patient, reviewed the resident's note and I agree with the findings and plan.    Dione Boozeavid Jasraj Lappe, MD 11/13/13 2151

## 2013-11-13 NOTE — ED Notes (Signed)
Pt reports having 7 teeth that are infected, having swelling to lip and jaw. Airway is intact. Tried to go to dentist today but they referred him here due to pt being unable to eat, feeling lightheaded and having nausea.

## 2016-11-10 ENCOUNTER — Telehealth: Payer: Self-pay | Admitting: Cardiology

## 2016-11-10 NOTE — Telephone Encounter (Signed)
New Message  Pt call requesting to speak with RN about a medication she states the pt was to take with Ibuprofen. Please call back to discuss

## 2017-08-18 ENCOUNTER — Other Ambulatory Visit: Payer: Self-pay

## 2017-08-18 ENCOUNTER — Emergency Department (HOSPITAL_COMMUNITY): Payer: Medicare Other

## 2017-08-18 ENCOUNTER — Encounter (HOSPITAL_COMMUNITY): Payer: Self-pay | Admitting: Emergency Medicine

## 2017-08-18 ENCOUNTER — Emergency Department (HOSPITAL_COMMUNITY)
Admission: EM | Admit: 2017-08-18 | Discharge: 2017-08-18 | Disposition: A | Discharge status: Home / Self Care | Payer: Medicare Other | Attending: Emergency Medicine | Admitting: Emergency Medicine

## 2017-08-18 DIAGNOSIS — N201 Calculus of ureter: Secondary | ICD-10-CM | POA: Insufficient documentation

## 2017-08-18 DIAGNOSIS — F17210 Nicotine dependence, cigarettes, uncomplicated: Secondary | ICD-10-CM | POA: Diagnosis not present

## 2017-08-18 DIAGNOSIS — I2510 Atherosclerotic heart disease of native coronary artery without angina pectoris: Secondary | ICD-10-CM | POA: Diagnosis not present

## 2017-08-18 DIAGNOSIS — I10 Essential (primary) hypertension: Secondary | ICD-10-CM | POA: Diagnosis not present

## 2017-08-18 DIAGNOSIS — Z79899 Other long term (current) drug therapy: Secondary | ICD-10-CM | POA: Diagnosis not present

## 2017-08-18 DIAGNOSIS — N179 Acute kidney failure, unspecified: Secondary | ICD-10-CM | POA: Insufficient documentation

## 2017-08-18 DIAGNOSIS — R1013 Epigastric pain: Secondary | ICD-10-CM | POA: Diagnosis present

## 2017-08-18 LAB — LIPASE, BLOOD: LIPASE: 28 U/L (ref 11–51)

## 2017-08-18 LAB — CBC
HEMATOCRIT: 44.6 % (ref 39.0–52.0)
Hemoglobin: 14.6 g/dL (ref 13.0–17.0)
MCH: 31.7 pg (ref 26.0–34.0)
MCHC: 32.7 g/dL (ref 30.0–36.0)
MCV: 96.7 fL (ref 78.0–100.0)
PLATELETS: 280 10*3/uL (ref 150–400)
RBC: 4.61 MIL/uL (ref 4.22–5.81)
RDW: 13.7 % (ref 11.5–15.5)
WBC: 14 10*3/uL — ABNORMAL HIGH (ref 4.0–10.5)

## 2017-08-18 LAB — URINALYSIS, ROUTINE W REFLEX MICROSCOPIC
BACTERIA UA: NONE SEEN
Bilirubin Urine: NEGATIVE
Glucose, UA: NEGATIVE mg/dL
KETONES UR: NEGATIVE mg/dL
Leukocytes, UA: NEGATIVE
NITRITE: NEGATIVE
PH: 5 (ref 5.0–8.0)
Protein, ur: NEGATIVE mg/dL
SPECIFIC GRAVITY, URINE: 1.017 (ref 1.005–1.030)

## 2017-08-18 LAB — COMPREHENSIVE METABOLIC PANEL
ALT: 12 U/L — ABNORMAL LOW (ref 17–63)
AST: 16 U/L (ref 15–41)
Albumin: 3.5 g/dL (ref 3.5–5.0)
Alkaline Phosphatase: 68 U/L (ref 38–126)
Anion gap: 13 (ref 5–15)
BILIRUBIN TOTAL: 0.9 mg/dL (ref 0.3–1.2)
BUN: 18 mg/dL (ref 6–20)
CHLORIDE: 95 mmol/L — AB (ref 101–111)
CO2: 26 mmol/L (ref 22–32)
Calcium: 9.4 mg/dL (ref 8.9–10.3)
Creatinine, Ser: 1.83 mg/dL — ABNORMAL HIGH (ref 0.61–1.24)
GFR, EST AFRICAN AMERICAN: 41 mL/min — AB (ref >60)
GFR, EST NON AFRICAN AMERICAN: 36 mL/min — AB (ref >60)
Glucose, Bld: 182 mg/dL — ABNORMAL HIGH (ref 65–99)
POTASSIUM: 4.2 mmol/L (ref 3.5–5.1)
Sodium: 134 mmol/L — ABNORMAL LOW (ref 135–145)
TOTAL PROTEIN: 7.9 g/dL (ref 6.5–8.1)

## 2017-08-18 MED ORDER — IOPAMIDOL (ISOVUE-300) INJECTION 61%: 15.0000 mL | INTRAVENOUS | Status: AC

## 2017-08-18 MED ORDER — IOPAMIDOL (ISOVUE-300) INJECTION 61%
INTRAVENOUS | Status: AC
  Filled 2017-08-18: qty 30

## 2017-08-18 MED ORDER — KETOROLAC TROMETHAMINE 30 MG/ML IJ SOLN
15.0000 mg | Freq: Once | INTRAMUSCULAR | Status: AC
  Administered 2017-08-18: 15 mg via INTRAVENOUS
  Filled 2017-08-18: qty 1

## 2017-08-18 MED ORDER — SODIUM CHLORIDE 0.9 % IV BOLUS
500.0000 mL | Freq: Once | INTRAVENOUS | Status: AC
  Administered 2017-08-18: 500 mL via INTRAVENOUS

## 2017-08-18 MED ORDER — ONDANSETRON 8 MG PO TBDP: 8.0000 mg | ORAL_TABLET | Freq: Three times a day (TID) | ORAL | 0 refills | Status: DC | PRN

## 2017-08-18 MED ORDER — SODIUM CHLORIDE 0.9 % IV BOLUS
1000.0000 mL | Freq: Once | INTRAVENOUS | Status: AC
  Administered 2017-08-18: 1000 mL via INTRAVENOUS

## 2017-08-18 MED ORDER — CIPROFLOXACIN HCL 500 MG PO TABS
500.0000 mg | ORAL_TABLET | Freq: Once | ORAL | Status: AC
  Administered 2017-08-18: 500 mg via ORAL
  Filled 2017-08-18: qty 1

## 2017-08-18 MED ORDER — CIPROFLOXACIN HCL 500 MG PO TABS: 500.0000 mg | ORAL_TABLET | Freq: Two times a day (BID) | ORAL | 0 refills | Status: DC

## 2017-08-18 MED ORDER — ONDANSETRON HCL 4 MG/2ML IJ SOLN
4.0000 mg | Freq: Once | INTRAMUSCULAR | Status: AC
  Administered 2017-08-18: 4 mg via INTRAVENOUS
  Filled 2017-08-18: qty 2

## 2017-08-18 MED ORDER — OXYCODONE-ACETAMINOPHEN 5-325 MG PO TABS: 1.0000 | ORAL_TABLET | ORAL | 0 refills | Status: DC | PRN

## 2017-08-18 NOTE — ED Triage Notes (Signed)
Pt. Stated, (hard of hearing) Ive not gone to the bathroom in a week. Ive taken everything imaginable. Its making me have dry heaves.

## 2017-08-18 NOTE — ED Notes (Signed)
Pt states he has not had a BM in about 1 week; pt states he has been dry heaving but nothing has come up

## 2017-08-18 NOTE — Discharge Instructions (Addendum)
You have two kidney stones on the right Take all antibiotics as ordered Call urology office Monday morning for recheck Return if worse at any time especially worsening pain, fever, or unable to drink fluids.

## 2017-08-18 NOTE — ED Provider Notes (Signed)
[facility] EMERGENCY DEPARTMENT Provider Note   CSN: [id number] Arrival date & time: 08/18/17  2130     History   Chief Complaint Chief Complaint  Patient presents with  . Constipation  . Nausea  . Abdominal Pain    HPI Richard Reid is a 72 y.o. male.  HPI 72 year old man history of hypertension, coronary artery disease, presents today complaining of abdominal pain and no bowel movement for 1 week.  He states he has been nauseated and has not been able to eat very well.  He has not had a normal bowel movement for the past week.  He feels that he is constipated.  He denies chest pain, fever, chills, frequency of urination, inability to urinate, or dysuria.  He reports some stool leakage.  Denies any blood or dark tarry stools.  He reports eating prunes and taking over-the-counter medications for constipation Past Medical History:  Diagnosis Date  . Coronary artery disease    LHC 12/2000:  mLAD stent ok, dLAD 95% at apex, very small intermediate 95% at origin, CFX < 20%, dRCA < 20%, EF 65%.  -  med Rx  . Glucose intolerance (impaired glucose tolerance)   . Hyperlipidemia   . Hypertension   . Obesity     Patient Active Problem List   Diagnosis Date Noted  . Hypertension 12/06/2011  . Short of breath on exertion 12/06/2011  . Coronary atherosclerosis of native coronary artery 12/06/2011  . HLD (hyperlipidemia) 12/06/2011  . Glucose intolerance (impaired glucose tolerance) 12/06/2011    Past Surgical History:  Procedure Laterality Date  . CARDIAC CATHETERIZATION    . STENT TO  MID LAD           Home Medications    Prior to Admission medications   Medication Sig Start Date End Date Taking? Authorizing Provider  amLODipine (NORVASC) 10 MG tablet Take 10 mg by mouth daily.    [provider]  aspirin-acetaminophen-caffeine (EXCEDRIN EXTRA STRENGTH) [phone] MG per tablet Take 1 tablet by mouth 3 (three) times daily as needed (pain).     [provider]  Benzocaine (ORAJEL MT) Use as directed 1 application in the mouth or throat as needed (tooth pain).    [provider]  hydrochlorothiazide (HYDRODIURIL) 25 MG tablet Take 25 mg by mouth daily.    [provider]  lisinopril (PRINIVIL,ZESTRIL) 20 MG tablet Take 20 mg by mouth daily.    [provider]  metoprolol succinate (TOPROL-XL) 100 MG 24 hr tablet Take 100 mg by mouth daily. Take with or immediately following a meal.    [provider]  nitroGLYCERIN (NITROSTAT) 0.4 MG SL tablet Place 0.4 mg under the tongue every 5 (five) minutes as needed for chest pain.    [provider]  simvastatin (ZOCOR) 20 MG tablet Take 1 tablet (20 mg total) by mouth at bedtime. 02/28/13   [place], Peter M, MD    Family History Family History  Problem Relation Age of Onset  . Heart disease Father   . Heart failure Father     Social History Social History   Tobacco Use  . Smoking status: Current Every Day Smoker    Packs/day: 0.50    Years: 4.00    Pack years: 2.00    Types: Cigarettes  . Smokeless tobacco: Current User    Types: Snuff, Chew  Substance Use Topics  . Alcohol use: No  . Drug use: No     Allergies  Morphine and related   Review of Systems Review of Systems  All other systems reviewed and are negative.    Physical Exam Updated Vital Signs BP (!) 121/91 (BP Location: Right Arm)   Pulse 82   Temp 98.8 F (37.1 C) (Oral)   Resp 18   Ht 1.765 m (5' 9.5")   Wt 114.3 kg (252 lb)   SpO2 96%   BMI 36.68 kg/m   Physical Exam  Constitutional: He appears well-developed and well-nourished.  HENT:  Head: Normocephalic.  Mouth/Throat: Oropharynx is clear and moist.  Cardiovascular: Normal rate and normal heart sounds.  Pulmonary/Chest: Effort normal.  Abdominal: Soft. Normal appearance. Bowel sounds are absent. There is tenderness in the right lower quadrant and epigastric area.  Genitourinary: Rectum  normal, testes normal and penis normal. Rectal exam shows no mass and no tenderness.  Neurological: He is alert.  Skin: Skin is warm. Capillary refill takes less than 2 seconds.  Psychiatric: He has a normal mood and affect.  Nursing note and vitals reviewed.    ED Treatments / Results  Labs (all labs ordered are listed, but only abnormal results are displayed) Labs Reviewed  CBC - Abnormal; Notable for the following components:      Result Value   WBC 14.0 (*)    All other components within normal limits  URINALYSIS, ROUTINE W REFLEX MICROSCOPIC - Abnormal; Notable for the following components:   Hgb urine dipstick MODERATE (*)    Squamous Epithelial / LPF 0-5 (*)    All other components within normal limits  LIPASE, BLOOD  COMPREHENSIVE METABOLIC PANEL    EKG None  Radiology Ct Abdomen Pelvis Wo Contrast  Result Date: 08/18/2017 CLINICAL DATA:  Abdominal pain.  Constipation. Ive not gone to the bathroom in a week. Ive taken everything imaginable. Its making me have dry heaves EXAM: CT ABDOMEN AND PELVIS WITHOUT CONTRAST TECHNIQUE: Multidetector CT imaging of the abdomen and pelvis was performed following the standard protocol without IV contrast. COMPARISON:  None. FINDINGS: Lower chest: No acute abnormality. Hepatobiliary: Decreased attenuation of the liver consistent with fatty infiltration. No mass or focal lesion. Gallbladder unremarkable. No bile duct dilation. Pancreas: Unremarkable. No pancreatic ductal dilatation or surrounding inflammatory changes. Spleen: Normal in size without focal abnormality. Adrenals/Urinary Tract: No adrenal masses. Moderate to marked right hydronephrosis. This is due to a 4 mm stone in the proximal ureter just below the ureteropelvic junction, as well as a 3 mm stone in the distal ureter just above the ureterovesicular junction. No other ureteral stones. Left kidney demonstrates an extrarenal pelvis, but no hydronephrosis. Left ureter is normal. Three  nonobstructing stones in the mid to lower pole the right kidney. No intrarenal stones on the left. No renal masses. Bladder is unremarkable. Stomach/Bowel: Stomach and small bowel are unremarkable. No colonic dilation, wall thickening or inflammation. Average colonic stool burden. Normal appendix visualized. Vascular/Lymphatic: Aortic atherosclerosis. No enlarged abdominal or pelvic lymph nodes. Reproductive: Unremarkable. Other: No abdominal wall hernia or abnormality. No abdominopelvic ascites. Musculoskeletal: No fracture or acute finding. No osteoblastic or osteolytic lesions. IMPRESSION: 1. There are 2 right ureteral stones, 1 proximally and 1 distally, which causes moderate to marked right hydronephrosis. 2. No other acute findings. 3. Nonobstructing right intrarenal stones. 4. Average colonic stool burden. No constipation. No bowel inflammation or obstruction. 5. Hepatic steatosis. 6. Aortic atherosclerosis. Electronically Signed   By: Amie Portlandavid  Ormond M.D.   On: 08/18/2017 15:05    Procedures Procedures (including critical care time)  Medications Ordered in ED Medications - No data to display   Initial Impression / Assessment and Plan / ED Course  I have reviewed the triage vital signs and the nursing notes.  Pertinent labs & imaging results that were available during my care of the patient were reviewed by me and considered in my medical decision making (see chart for details).     72 year old male with abdominal pain for 1 week with associated nausea and vomiting who presented thinking that he is constipated.CT results show two right ureteral stones Pain controlled here No vomiting and patient taking p.o. Remainder of labs appear within normal limits with the exception of some white blood cells in urine We will culture urine.  Patient receiving Cipro here.  Plan referral to urology.  Patient with some AK I.  He is being IV hydrated here and is able to tolerate p.o.  He is advised  regarding need for close follow-up.  Final Clinical Impressions(s) / ED Diagnoses   Final diagnoses:  None    ED Discharge Orders    None       Margarita Grizzle, MD 08/18/17 1609

## 2017-08-19 LAB — URINE CULTURE: Culture: NO GROWTH

## 2019-02-12 ENCOUNTER — Other Ambulatory Visit: Payer: Self-pay

## 2019-02-12 DIAGNOSIS — Z20822 Contact with and (suspected) exposure to covid-19: Secondary | ICD-10-CM

## 2019-02-13 LAB — NOVEL CORONAVIRUS, NAA: SARS-CoV-2, NAA: NOT DETECTED

## 2019-02-19 ENCOUNTER — Other Ambulatory Visit: Payer: Self-pay

## 2019-02-19 DIAGNOSIS — Z20822 Contact with and (suspected) exposure to covid-19: Secondary | ICD-10-CM

## 2019-02-20 LAB — NOVEL CORONAVIRUS, NAA: SARS-CoV-2, NAA: DETECTED — AB

## 2019-09-23 IMAGING — CT CT ABD-PELV W/O CM
2 of 4 series · 16 of 46 positions shown, 18 images · non-contrast
Comparison: None.

CLINICAL DATA: Abdominal pain.  Constipation.

Ive not gone to the bathroom in a week. Ive taken everything
imaginable. Its making me have dry heaves
EXAM:
CT ABDOMEN AND PELVIS WITHOUT CONTRAST
TECHNIQUE: Multidetector CT imaging of the abdomen and pelvis was performed
following the standard protocol without IV contrast.

[Series 3: a/p w/o 5mm · axial · non-contrast · 0.97mm/px · z∈[-610,-145]mm · 13 of 103 slices shown, 15 images]
[im 5/103  soft-tissue]
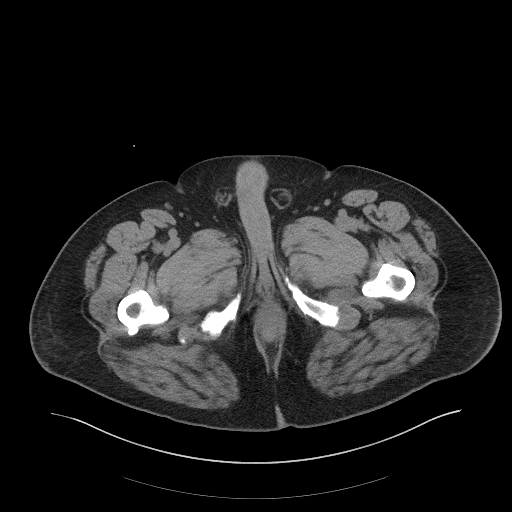
[im 5/103  bone]
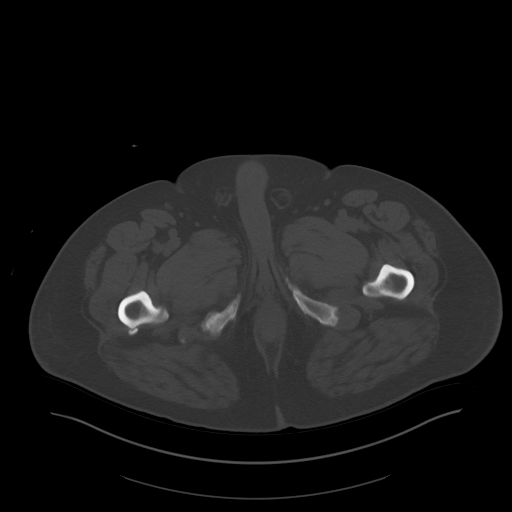
[im 13/103  soft-tissue]
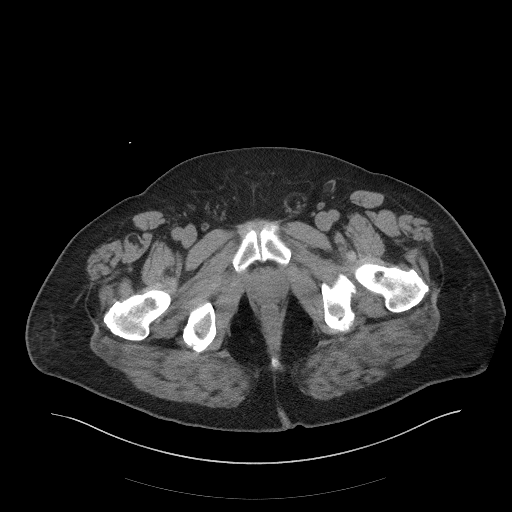
[im 21/103  soft-tissue]
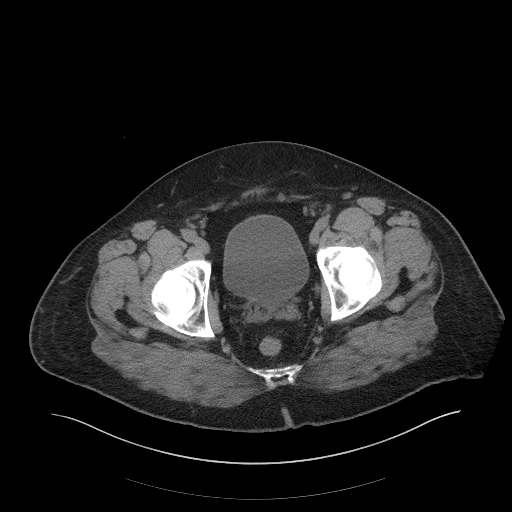
[im 29/103  soft-tissue]
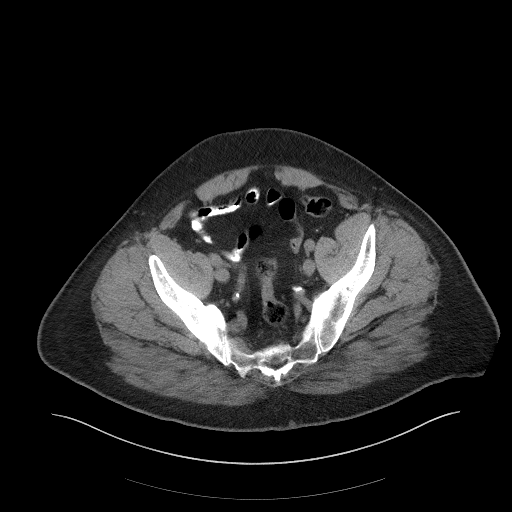
[im 37/103  soft-tissue]
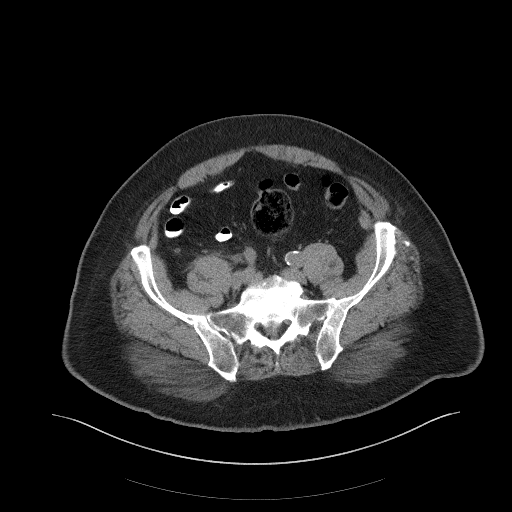
[im 45/103  soft-tissue]
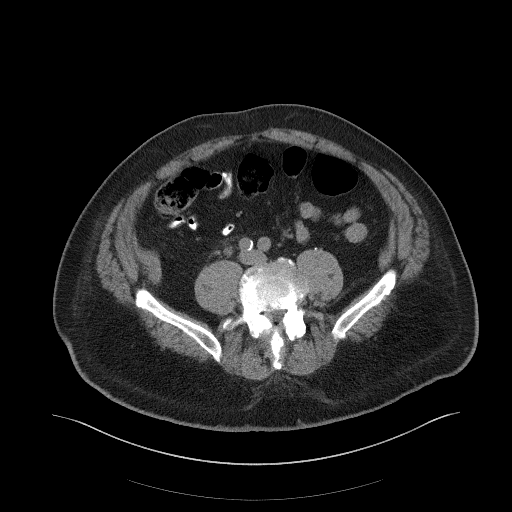
[im 54/103  soft-tissue]
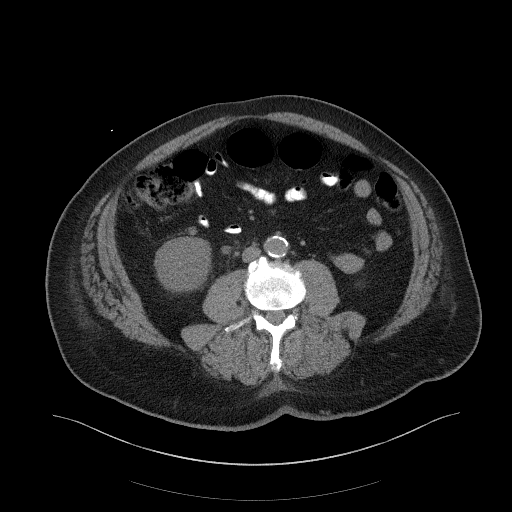
[im 58/103  soft-tissue]
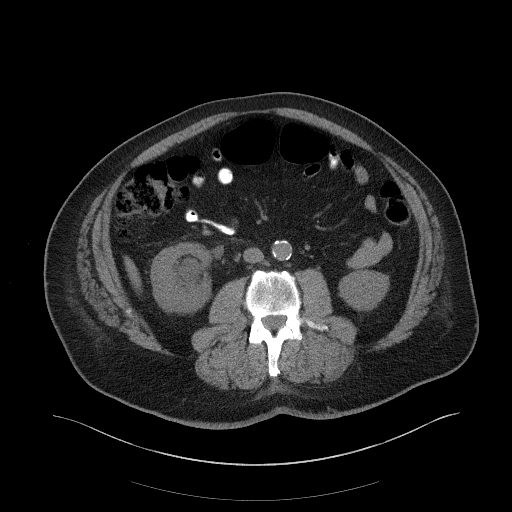
[im 66/103  soft-tissue]
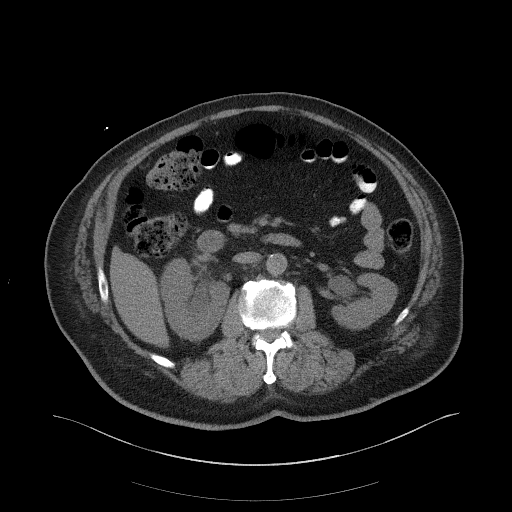
[im 66/103  bone]
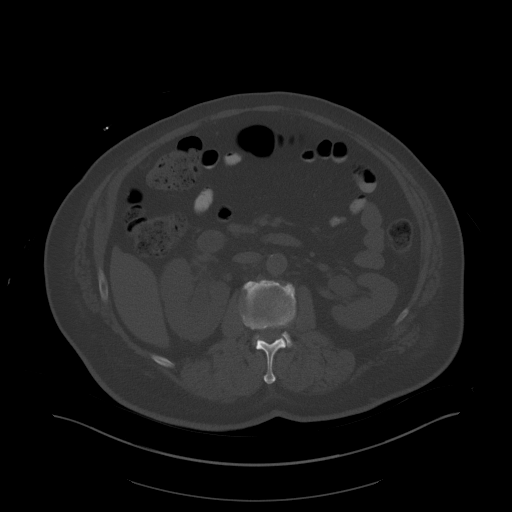
[im 74/103  soft-tissue]
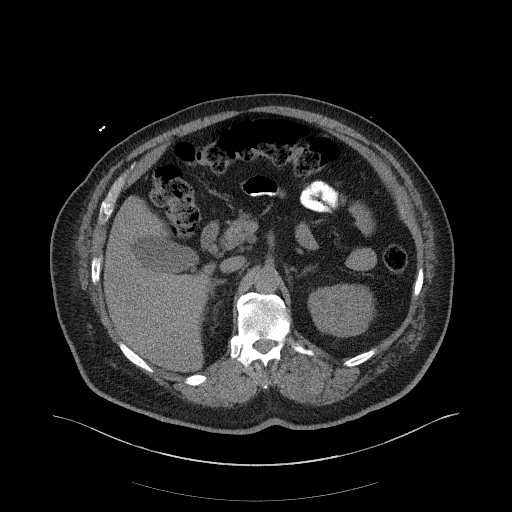
[im 82/103  soft-tissue]
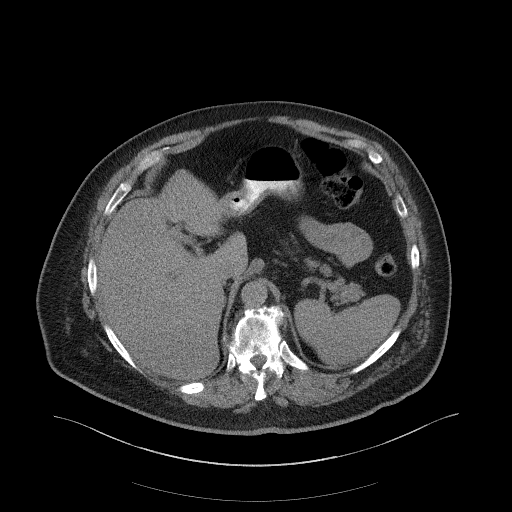
[im 90/103  soft-tissue]
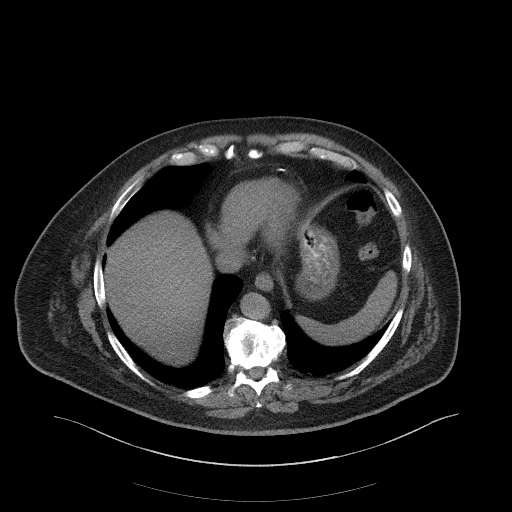
[im 98/103  soft-tissue]
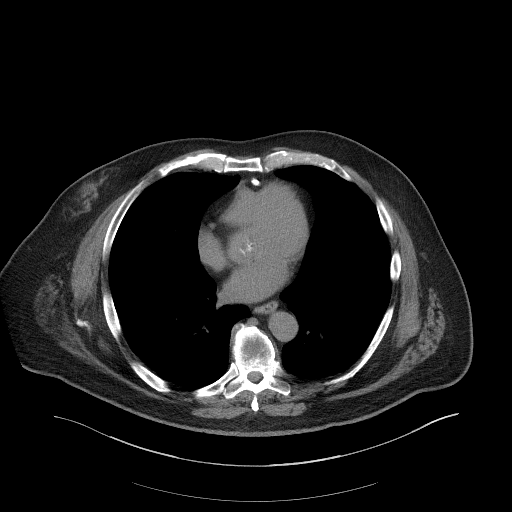

[Series 6: a/p w/o cor · coronal · non-contrast · 0.99mm/px · 3 of 158 slices shown]
[im 53/158  soft-tissue]
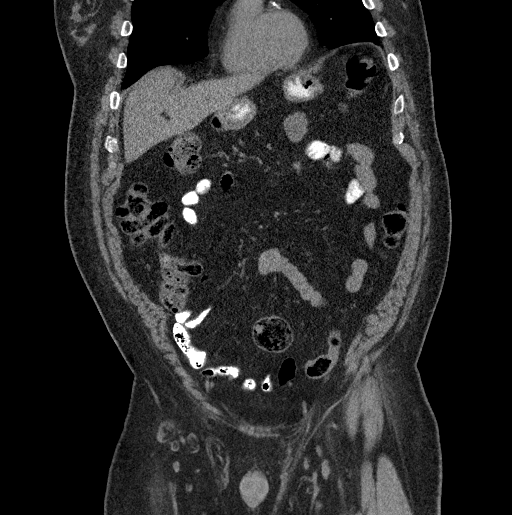
[im 70/158  soft-tissue]
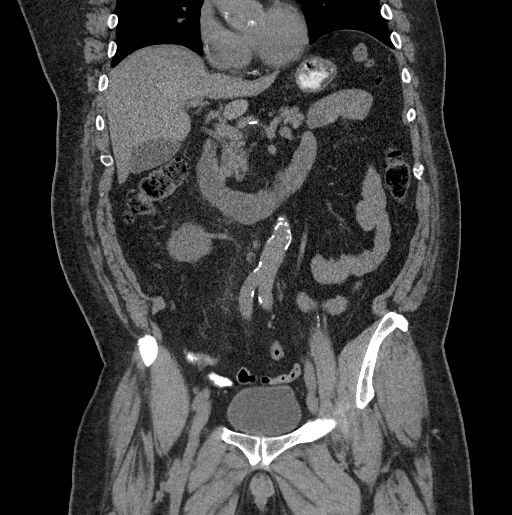
[im 88/158  soft-tissue]
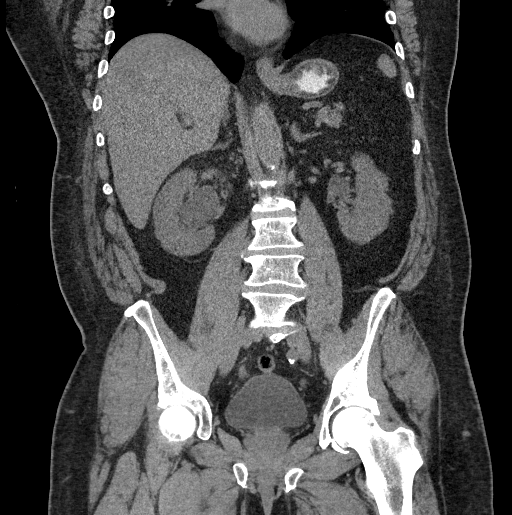

[16 of 46 positions shown; findings below may reference images not displayed]

FINDINGS: Lower chest: No acute abnormality.

Hepatobiliary: Decreased attenuation of the liver consistent with
fatty infiltration. No mass or focal lesion. Gallbladder
unremarkable. No bile duct dilation.

Pancreas: Unremarkable. No pancreatic ductal dilatation or
surrounding inflammatory changes.

Spleen: Normal in size without focal abnormality.

Adrenals/Urinary Tract: No adrenal masses.

Moderate to marked right hydronephrosis. This is due to a 4 mm stone
in the proximal ureter just below the ureteropelvic junction, as
well as a 3 mm stone in the distal ureter just above the
ureterovesicular junction. No other ureteral stones. Left kidney
demonstrates an extrarenal pelvis, but no hydronephrosis. Left
ureter is normal. Three nonobstructing stones in the mid to lower
pole the right kidney. No intrarenal stones on the left. No renal
masses.

Bladder is unremarkable.

Stomach/Bowel: Stomach and small bowel are unremarkable. No colonic
dilation, wall thickening or inflammation. Average colonic stool
burden. Normal appendix visualized.

Vascular/Lymphatic: Aortic atherosclerosis. No enlarged abdominal or
pelvic lymph nodes.

Reproductive: Unremarkable.

Other: No abdominal wall hernia or abnormality. No abdominopelvic
ascites.

Musculoskeletal: No fracture or acute finding. No osteoblastic or
osteolytic lesions.
IMPRESSION: 1. There are 2 right ureteral stones, 1 proximally and 1 distally,
which causes moderate to marked right hydronephrosis.
2. No other acute findings.
3. Nonobstructing right intrarenal stones.
4. Average colonic stool burden. No constipation. No bowel
inflammation or obstruction.
5. Hepatic steatosis.
6. Aortic atherosclerosis.

## 2021-04-20 ENCOUNTER — Emergency Department (HOSPITAL_BASED_OUTPATIENT_CLINIC_OR_DEPARTMENT_OTHER): Payer: Medicare Other

## 2021-04-20 ENCOUNTER — Emergency Department (HOSPITAL_COMMUNITY): Payer: Medicare Other

## 2021-04-20 ENCOUNTER — Encounter (HOSPITAL_COMMUNITY): Payer: Self-pay | Admitting: Emergency Medicine

## 2021-04-20 ENCOUNTER — Emergency Department (HOSPITAL_COMMUNITY)
Admission: EM | Admit: 2021-04-20 | Discharge: 2021-04-20 | Disposition: A | Discharge status: Home / Self Care | Payer: Medicare Other | Attending: Emergency Medicine | Admitting: Emergency Medicine

## 2021-04-20 DIAGNOSIS — E1169 Type 2 diabetes mellitus with other specified complication: Secondary | ICD-10-CM | POA: Insufficient documentation

## 2021-04-20 DIAGNOSIS — R0602 Shortness of breath: Secondary | ICD-10-CM | POA: Insufficient documentation

## 2021-04-20 DIAGNOSIS — R600 Localized edema: Secondary | ICD-10-CM

## 2021-04-20 DIAGNOSIS — F17220 Nicotine dependence, chewing tobacco, uncomplicated: Secondary | ICD-10-CM | POA: Diagnosis not present

## 2021-04-20 DIAGNOSIS — M7989 Other specified soft tissue disorders: Secondary | ICD-10-CM

## 2021-04-20 DIAGNOSIS — I2510 Atherosclerotic heart disease of native coronary artery without angina pectoris: Secondary | ICD-10-CM | POA: Insufficient documentation

## 2021-04-20 DIAGNOSIS — Z794 Long term (current) use of insulin: Secondary | ICD-10-CM | POA: Diagnosis not present

## 2021-04-20 DIAGNOSIS — Z79899 Other long term (current) drug therapy: Secondary | ICD-10-CM | POA: Insufficient documentation

## 2021-04-20 DIAGNOSIS — Z20822 Contact with and (suspected) exposure to covid-19: Secondary | ICD-10-CM | POA: Insufficient documentation

## 2021-04-20 DIAGNOSIS — I10 Essential (primary) hypertension: Secondary | ICD-10-CM | POA: Diagnosis not present

## 2021-04-20 DIAGNOSIS — Z7984 Long term (current) use of oral hypoglycemic drugs: Secondary | ICD-10-CM | POA: Insufficient documentation

## 2021-04-20 LAB — BASIC METABOLIC PANEL
Anion gap: 12 (ref 5–15)
BUN: 11 mg/dL (ref 8–23)
CO2: 27 mmol/L (ref 22–32)
Calcium: 9.5 mg/dL (ref 8.9–10.3)
Chloride: 95 mmol/L — ABNORMAL LOW (ref 98–111)
Creatinine, Ser: 1.21 mg/dL (ref 0.61–1.24)
GFR, Estimated: >60 mL/min (ref >60)
Glucose, Bld: 184 mg/dL — ABNORMAL HIGH (ref 70–99)
Potassium: 3.9 mmol/L (ref 3.5–5.1)
Sodium: 134 mmol/L — ABNORMAL LOW (ref 135–145)

## 2021-04-20 LAB — BRAIN NATRIURETIC PEPTIDE: B Natriuretic Peptide: 41.7 pg/mL (ref 0.0–100.0)

## 2021-04-20 LAB — CBC
HCT: 43.3 % (ref 39.0–52.0)
Hemoglobin: 14.2 g/dL (ref 13.0–17.0)
MCH: 31.9 pg (ref 26.0–34.0)
MCHC: 32.8 g/dL (ref 30.0–36.0)
MCV: 97.3 fL (ref 80.0–100.0)
Platelets: 289 10*3/uL (ref 150–400)
RBC: 4.45 MIL/uL (ref 4.22–5.81)
RDW: 13 % (ref 11.5–15.5)
WBC: 9.7 10*3/uL (ref 4.0–10.5)
nRBC: 0 % (ref 0.0–0.2)

## 2021-04-20 LAB — RESP PANEL BY RT-PCR (FLU A&B, COVID) ARPGX2
Influenza A by PCR: NEGATIVE
Influenza B by PCR: NEGATIVE
SARS Coronavirus 2 by RT PCR: NEGATIVE

## 2021-04-20 LAB — TROPONIN I (HIGH SENSITIVITY)
Troponin I (High Sensitivity): 12 ng/L (ref <18)
Troponin I (High Sensitivity): 12 ng/L (ref <18)

## 2021-04-20 MED ORDER — FUROSEMIDE 10 MG/ML IJ SOLN
40.0000 mg | Freq: Once | INTRAMUSCULAR | Status: AC
  Administered 2021-04-20: 40 mg via INTRAVENOUS
  Filled 2021-04-20: qty 4

## 2021-04-20 NOTE — ED Triage Notes (Signed)
Wife stated, He has started having swelling in his legs and especially in left leg, ankle and foot. The left foot is warmer than the right.

## 2021-04-20 NOTE — Progress Notes (Signed)
Lower extremity venous bilateral study completed.  Preliminary results relayed to [place], [place] via secure chat.  See CV Proc for preliminary results report.   Jean Rosenthal, RDMS, RVT

## 2021-04-20 NOTE — ED Provider Notes (Signed)
[facility] EMERGENCY DEPARTMENT Provider Note   CSN: [id number] Arrival date & time: 04/20/21  0801     History Chief Complaint  Patient presents with   Leg Swelling   Shortness of Breath    Richard Reid is a 75 y.o. male.  HPI Patient is a 75 year old male with a history of CAD, hyperlipidemia, hypertension, diabetes mellitus, who presents to the emergency department due to pedal edema as well as shortness of breath.  His wife states that he has had intermittent pedal edema but over the last few days this has continued to worsen.  She states his PCP increased his dose of furosemide to 40 mg 3 times per day about 2 weeks ago.  She states that over the past 24 hours his leg swelling has worsened significantly.  He notes worsening pain in the lower extremities.  Also complains of acute on chronic shortness of breath and states that he has to sleep on the couch with his head upright to keep from getting short of breath.  No chest pain.  No vomiting, diarrhea, URI symptoms.    Past Medical History:  Diagnosis Date   Coronary artery disease    LHC 12/2000:  mLAD stent ok, dLAD 95% at apex, very small intermediate 95% at origin, CFX < 20%, dRCA < 20%, EF 65%.  -  med Rx   Glucose intolerance (impaired glucose tolerance)    Hyperlipidemia    Hypertension    Obesity     Patient Active Problem List   Diagnosis Date Noted   Hypertension 12/06/2011   Short of breath on exertion 12/06/2011   Coronary atherosclerosis of native coronary artery 12/06/2011   HLD (hyperlipidemia) 12/06/2011   Glucose intolerance (impaired glucose tolerance) 12/06/2011    Past Surgical History:  Procedure Laterality Date   CARDIAC CATHETERIZATION     STENT TO  MID LAD          Family History  Problem Relation Age of Onset   Heart disease Father    Heart failure Father     Social History   Tobacco Use   Smoking status: Former   Smokeless tobacco: Current    Types: Snuff,  Chew  Substance Use Topics   Alcohol use: No   Drug use: Not Currently    Home Medications Prior to Admission medications   Medication Sig Start Date End Date Taking? Authorizing Provider  furosemide (LASIX) 40 MG tablet Take 40 mg by mouth daily. 04/05/21  Yes [provider]  glipiZIDE (GLUCOTROL) 10 MG tablet Take 10 mg by mouth 2 (two) times daily. 04/05/21  Yes [provider]  hydrochlorothiazide (HYDRODIURIL) 25 MG tablet Take 25 mg by mouth daily.   Yes [provider]  levothyroxine (SYNTHROID) 25 MCG tablet Take 25 mcg by mouth daily. 02/18/21  Yes [provider]  lisinopril (PRINIVIL,ZESTRIL) 20 MG tablet Take 20 mg by mouth daily.   Yes [provider]  metFORMIN (GLUCOPHAGE) 1000 MG tablet Take 1,000 mg by mouth 2 (two) times daily. 04/05/21  Yes [provider]  metoprolol succinate (TOPROL-XL) 100 MG 24 hr tablet Take 100 mg by mouth daily. Take with or immediately following a meal.   Yes [provider]  nitroGLYCERIN (NITROSTAT) 0.4 MG SL tablet Place 0.4 mg under the tongue every 5 (five) minutes as needed for chest pain.   Yes [provider]  simvastatin (ZOCOR) 20 MG tablet Take 1 tablet (20 mg total) by mouth at  bedtime. 02/28/13  Yes [place], Peter M, MD  ciprofloxacin (CIPRO) 500 MG tablet Take 1 tablet (500 mg total) by mouth every 12 (twelve) hours. Patient not taking: Reported on 04/20/2021 08/18/17   Margarita Grizzle, MD  ondansetron (ZOFRAN ODT) 8 MG disintegrating tablet Take 1 tablet (8 mg total) by mouth every 8 (eight) hours as needed for nausea or vomiting. Patient not taking: Reported on 04/20/2021 08/18/17   Margarita Grizzle, MD  oxyCODONE-acetaminophen (PERCOCET/ROXICET) 5-325 MG tablet Take 1 tablet by mouth every 4 (four) hours as needed for severe pain. Patient not taking: Reported on 04/20/2021 08/18/17   Margarita Grizzle, MD    Allergies    Morphine and related  Review of Systems   Review of  Systems  All other systems reviewed and are negative. Ten systems reviewed and are negative for acute change, except as noted in the HPI.   Physical Exam Updated Vital Signs BP 126/89 (BP Location: Left Arm)   Pulse 96   Temp 98.7 F (37.1 C) (Oral)   Resp 18   SpO2 98%   Physical Exam Vitals and nursing note reviewed.  Constitutional:      General: He is not in acute distress.    Appearance: Normal appearance. He is well-developed. He is not ill-appearing, toxic-appearing or diaphoretic.     Interventions: He is not intubated. HENT:     Head: Normocephalic and atraumatic.     Right Ear: External ear normal.     Left Ear: External ear normal.     Nose: Nose normal.     Mouth/Throat:     Mouth: Mucous membranes are moist.     Pharynx: Oropharynx is clear. No oropharyngeal exudate or posterior oropharyngeal erythema.  Eyes:     Extraocular Movements: Extraocular movements intact.  Cardiovascular:     Rate and Rhythm: Normal rate and regular rhythm.     Pulses: Normal pulses.     Heart sounds: Normal heart sounds. No murmur heard.   No friction rub. No gallop.  Pulmonary:     Effort: Pulmonary effort is normal. No tachypnea, bradypnea, accessory muscle usage or respiratory distress. He is not intubated.     Breath sounds: Normal breath sounds. No stridor. No decreased breath sounds, wheezing, rhonchi or rales.     Comments: LCTAB without wheezing, rales, or rhonchi. Abdominal:     General: Abdomen is flat.     Tenderness: There is no abdominal tenderness.  Musculoskeletal:        General: Normal range of motion.     Cervical back: Normal range of motion and neck supple. No tenderness.     Right lower leg: Tenderness present. Edema present.     Left lower leg: Tenderness present. Edema present.     Comments: 3+ pitting edema noted in the left lower extremity.  2+ pitting edema noted in the right lower extremity.  No overlying skin changes.  2+ DP pulses.  Skin:    General:  Skin is warm and dry.  Neurological:     General: No focal deficit present.     Mental Status: He is alert and oriented to person, place, and time.  Psychiatric:        Mood and Affect: Mood normal.        Behavior: Behavior normal.   ED Results / Procedures / Treatments   Labs (all labs ordered are listed, but only abnormal results are displayed) Labs Reviewed  BASIC METABOLIC PANEL - Abnormal; Notable for the following  components:      Result Value   Sodium 134 (*)    Chloride 95 (*)    Glucose, Bld 184 (*)    All other components within normal limits  RESP PANEL BY RT-PCR (FLU A&B, COVID) ARPGX2  CBC  BRAIN NATRIURETIC PEPTIDE  TROPONIN I (HIGH SENSITIVITY)  TROPONIN I (HIGH SENSITIVITY)   EKG EKG Interpretation  Date/Time:  Wednesday April 20 2021 08:15:52 EST Ventricular Rate:  96 PR Interval:  178 QRS Duration: 86 QT Interval:  358 QTC Calculation: 452 R Axis:   36 Text Interpretation: Normal sinus rhythm Possible Lateral infarct , age undetermined Abnormal ECG Confirmed by Ernie Avena ([id number]) on 04/20/2021 5:54:49 PM  Radiology DG Chest 2 View  Result Date: 04/20/2021 CLINICAL DATA:  Shortness of breath, leg swelling EXAM: CHEST - 2 VIEW COMPARISON:  None. FINDINGS: The cardiomediastinal silhouette is normal, allowing for slight rightward patient rotation. There is no focal consolidation or pulmonary edema. There is no pleural effusion or pneumothorax There is mild degenerative change in the thoracic spine. Cervical spine fusion hardware is noted. IMPRESSION: No radiographic evidence of acute cardiopulmonary process. Electronically Signed   By: Lesia Hausen M.D.   On: 04/20/2021 08:52   VAS [place] LOWER EXTREMITY VENOUS (DVT) (ONLY [facility] & [facility])  Result Date: 04/20/2021  Lower Venous DVT Study Patient Name:  Richard Reid  Date of Exam:   04/20/2021 Medical Rec #: [id number]        Accession #:    [id number] Date of Birth: January 16, 1946       Patient Gender: M Patient Age:    45 years Exam Location:  [facility] Procedure:      VAS [place] LOWER EXTREMITY VENOUS (DVT) Referring Phys: Placido Sou --------------------------------------------------------------------------------  Indications: Swelling.  Limitations: Poor ultrasound/tissue interface. Comparison Study: No prior studies. Performing Technologist: Jean Rosenthal RDMS, RVT  Examination Guidelines: A complete evaluation includes B-mode imaging, spectral Doppler, color Doppler, and power Doppler as needed of all accessible portions of each vessel. Bilateral testing is considered an integral part of a complete examination. Limited examinations for reoccurring indications may be performed as noted. The reflux portion of the exam is performed with the patient in reverse Trendelenburg.  +---------+---------------+---------+-----------+----------+--------------+ RIGHT    CompressibilityPhasicitySpontaneityPropertiesThrombus Aging +---------+---------------+---------+-----------+----------+--------------+ CFV      Full           Yes      Yes                                 +---------+---------------+---------+-----------+----------+--------------+ SFJ      Full                                                        +---------+---------------+---------+-----------+----------+--------------+ FV Prox  Full                                                        +---------+---------------+---------+-----------+----------+--------------+ FV Mid   Full                                                        +---------+---------------+---------+-----------+----------+--------------+  FV DistalFull                                                        +---------+---------------+---------+-----------+----------+--------------+ PFV      Full                                                        +---------+---------------+---------+-----------+----------+--------------+ POP      Full            Yes      Yes                                 +---------+---------------+---------+-----------+----------+--------------+ PTV      Full                                                        +---------+---------------+---------+-----------+----------+--------------+ PERO     Full                                                        +---------+---------------+---------+-----------+----------+--------------+ Gastroc  Full                                                        +---------+---------------+---------+-----------+----------+--------------+   +---------+---------------+---------+-----------+----------+-------------------+ LEFT     CompressibilityPhasicitySpontaneityPropertiesThrombus Aging      +---------+---------------+---------+-----------+----------+-------------------+ CFV      Full           Yes      Yes                                      +---------+---------------+---------+-----------+----------+-------------------+ SFJ      Full                                                             +---------+---------------+---------+-----------+----------+-------------------+ FV Prox  Full                                                             +---------+---------------+---------+-----------+----------+-------------------+ FV Mid   Full                                                             +---------+---------------+---------+-----------+----------+-------------------+  FV DistalFull                                                             +---------+---------------+---------+-----------+----------+-------------------+ PFV      Full                                                             +---------+---------------+---------+-----------+----------+-------------------+ POP      Full                                                              +---------+---------------+---------+-----------+----------+-------------------+ PTV      Full                                                             +---------+---------------+---------+-----------+----------+-------------------+ PERO                                                  Not well visualized +---------+---------------+---------+-----------+----------+-------------------+ Gastroc  Full                                                             +---------+---------------+---------+-----------+----------+-------------------+    Summary: RIGHT: - There is no evidence of deep vein thrombosis in the lower extremity.  - No cystic structure found in the popliteal fossa.  LEFT: - There is no evidence of deep vein thrombosis in the lower extremity. However, portions of this examination were limited- see technologist comments above.  - No cystic structure found in the popliteal fossa. - Ultrasound characteristics of enlarged lymph nodes noted in the groin.  *See table(s) above for measurements and observations.    Preliminary     Procedures Procedures   Medications Ordered in ED Medications  furosemide (LASIX) injection 40 mg (40 mg Intravenous Given 04/20/21 1623)    ED Course  I have reviewed the triage vital signs and the nursing notes.  Pertinent labs & imaging results that were available during my care of the patient were reviewed by me and considered in my medical decision making (see chart for details).  Clinical Course as of 04/20/21 1859  Wed Apr 20, 2021  1739 Patient reassessed.  Notes mild improvement in his symptoms after being given IV Lasix.  States he has filled 2, 1000 mL bedside urinals.  We will continue to monitor.  We will obtain a second troponin.  Will likely discharge  home. [LJ]    Clinical Course User Index [LJ] Placido Sou, PA-C   MDM Rules/Calculators/A&P                          Pt is a 75 y.o. male who presents to the emergency  department due to leg swelling as well as shortness of breath.  Labs: CBC without abnormalities. BMP with a sodium of 134, chloride of 95, glucose of 184. Respiratory panel is negative. BNP within normal limits at 41.7. Troponin of 12 with a repeat of 12.  Imaging: Chest x-ray shows no radiographic evidence of acute cardiopulmonary process. DVT ultrasound of the bilateral lower extremities showed no evidence of DVT in the left lower extremity.  No evidence of DVT in the right lower extremity.  I, Placido Sou, PA-C, personally reviewed and evaluated these images and lab results as part of my medical decision-making.  Unsure the source of patient's pedal edema.  Appears to likely be venous stasis.  Given that it was slightly worse in the left leg I obtained an ultrasound of the bilateral lower extremities which was negative for DVT.  BNP within normal limits at 41.7.  Troponin is reassuring and flat at 12.  No cellulitic changes noted on my exam.  No increased warmth.  CBC without leukocytosis.  Patient afebrile and not tachycardic.  Doubt developing infection in the lower extremities at this time.  Patient given a dose of IV Lasix.  He states that he filled the bedside urinal twice producing more than 2000 mL of urine.  Reports mild improvement in his symptoms.  He is now sleeping comfortably in bed.  He did endorse some mild shortness of breath.  His oxygen saturations have been fluctuating between 93% and 98% since arrival.  His chest x-ray shows no radiographic evidence of acute cardiopulmonary process.  Lungs are clear to auscultation bilaterally.  Speaking in clear and complete sentences.  No respiratory distress noted at this time.  Feel the patient is stable for discharge at this time and he and his wife are agreeable.  Recommended that he continue taking his furosemide as prescribed and additionally begin wearing compression stockings.  Recommended PCP follow-up soon as possible.  We  discussed return precautions.  His questions were answered and he was amicable at the time of discharge.  Note: Portions of this report may have been transcribed using voice recognition software. Every effort was made to ensure accuracy; however, inadvertent computerized transcription errors may be present.   Final Clinical Impression(s) / ED Diagnoses Final diagnoses:  Leg edema   Rx / DC Orders ED Discharge Orders     None        Placido Sou, PA-C 04/20/21 1859    Ernie Avena, MD 04/20/21 1934

## 2021-04-20 NOTE — Discharge Instructions (Addendum)
Please continue to give Richard Reid his furosemide as prescribed.  I would also recommend that you purchase compression stockings and have him begin wearing these throughout the day as well.  If he develops any new or worsening symptoms please bring him back to the emergency department.  Please follow-up with his primary care doctor regarding his visit today.

## 2023-05-26 IMAGING — CR DG CHEST 2V
2 series · 2 of 2 positions shown · non-contrast
Comparison: None.

CLINICAL DATA: Shortness of breath, leg swelling

EXAM:
CHEST - 2 VIEW

[chest pa]
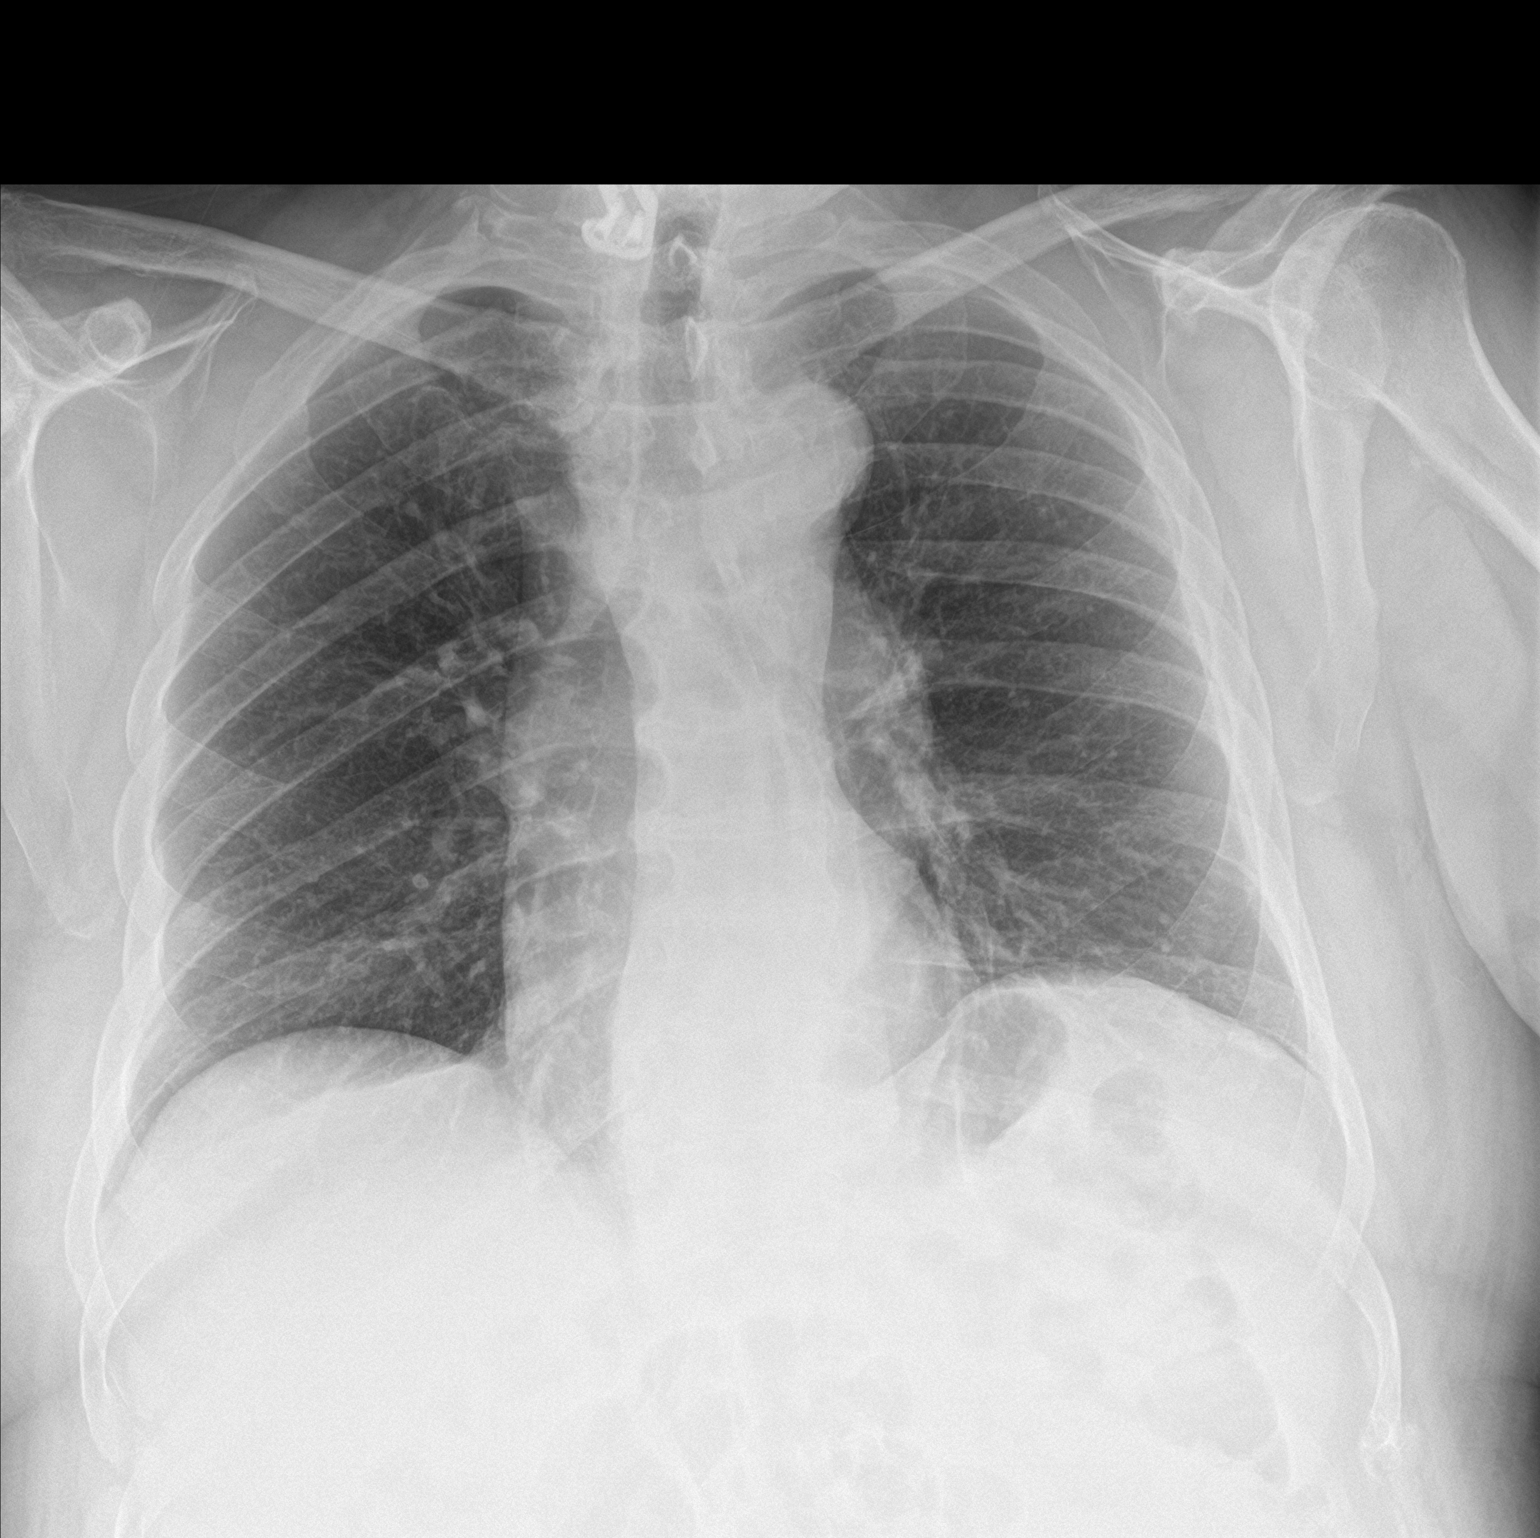

[chest lat]
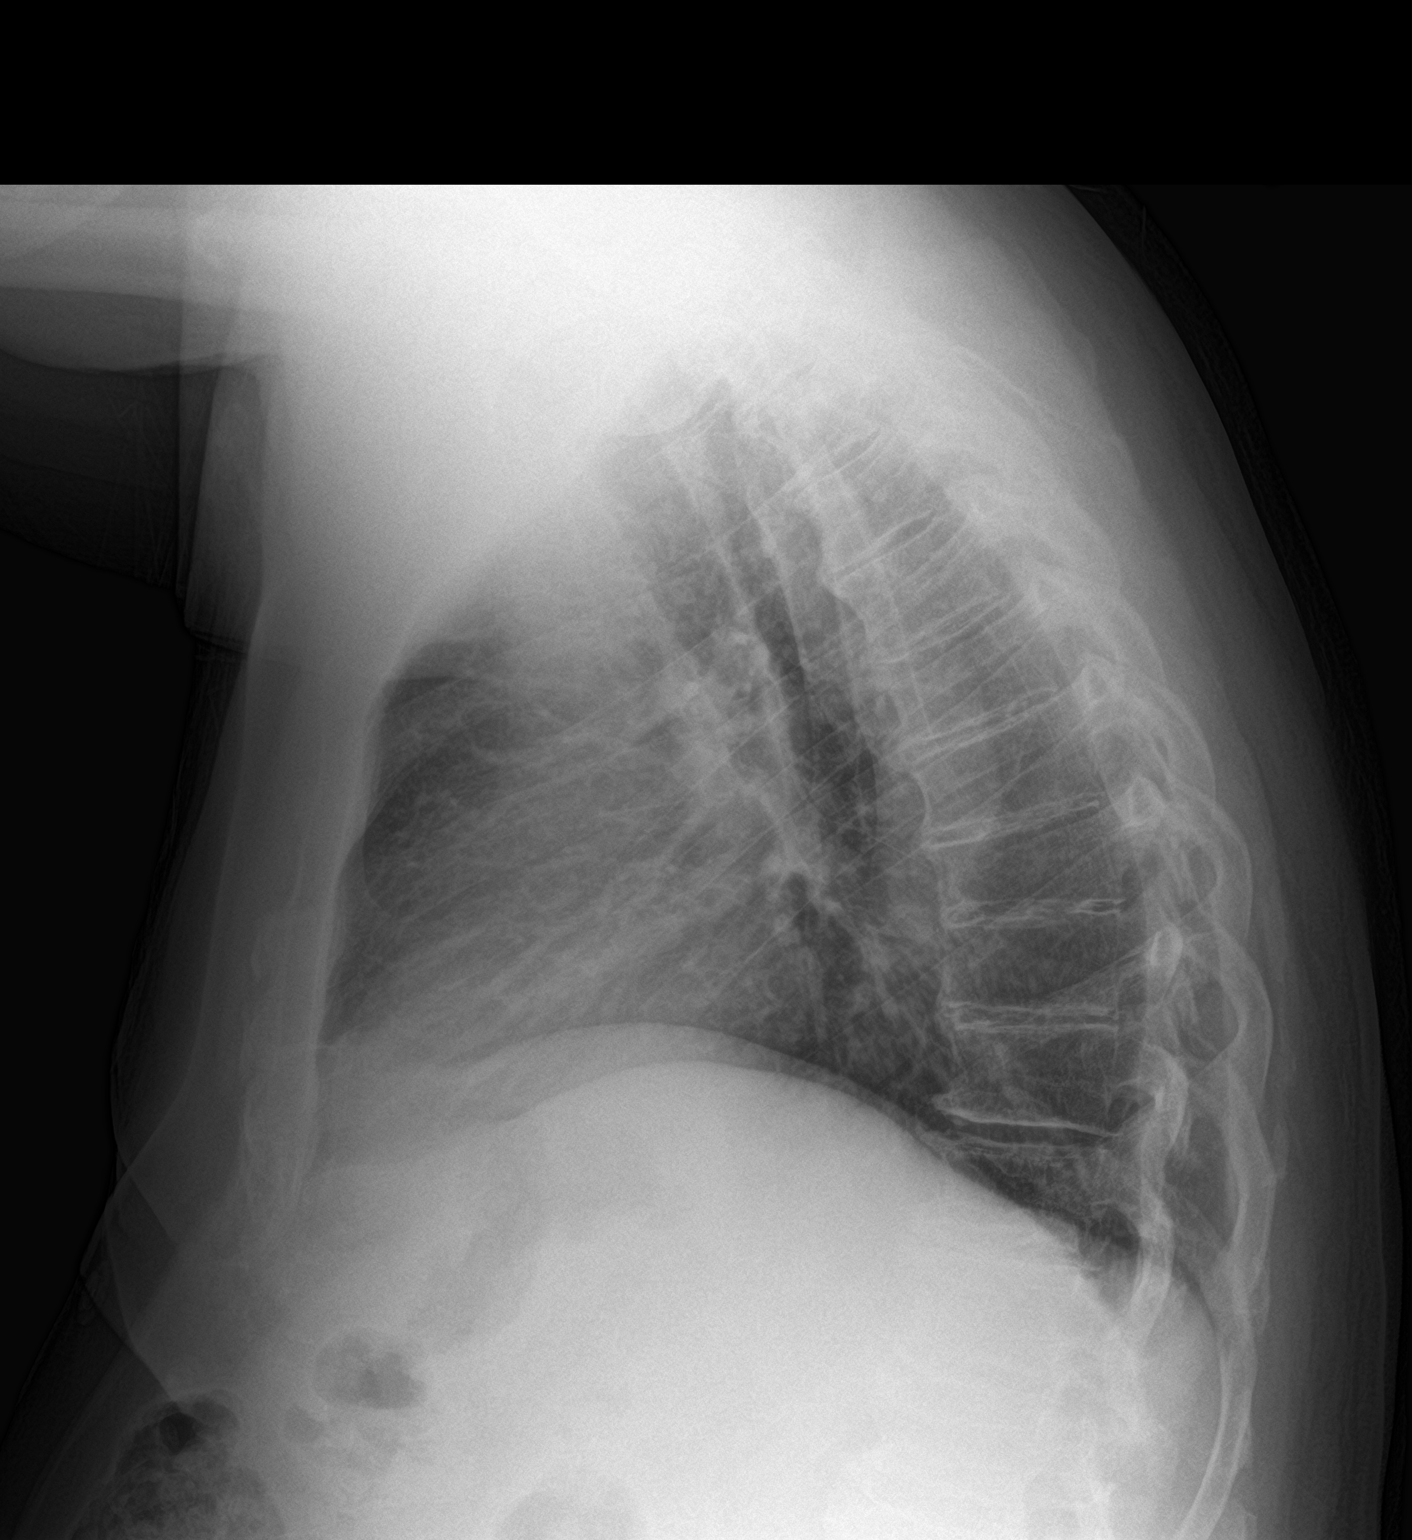

[2 of 2 positions shown; findings below may reference images not displayed]

FINDINGS: The cardiomediastinal silhouette is normal, allowing for slight
rightward patient rotation.

There is no focal consolidation or pulmonary edema. There is no
pleural effusion or pneumothorax

There is mild degenerative change in the thoracic spine. Cervical
spine fusion hardware is noted.
IMPRESSION: No radiographic evidence of acute cardiopulmonary process.

## 2023-06-19 ENCOUNTER — Encounter (HOSPITAL_COMMUNITY): Payer: Self-pay

## 2023-06-19 ENCOUNTER — Emergency Department (HOSPITAL_COMMUNITY): Payer: Medicare Other

## 2023-06-19 ENCOUNTER — Other Ambulatory Visit: Payer: Self-pay

## 2023-06-19 ENCOUNTER — Emergency Department (HOSPITAL_COMMUNITY)
Admission: EM | Admit: 2023-06-19 | Discharge: 2023-06-20 | Disposition: A | Discharge status: Home / Self Care | Payer: Medicare Other | Attending: Emergency Medicine | Admitting: Emergency Medicine

## 2023-06-19 DIAGNOSIS — Z79899 Other long term (current) drug therapy: Secondary | ICD-10-CM | POA: Insufficient documentation

## 2023-06-19 DIAGNOSIS — K529 Noninfective gastroenteritis and colitis, unspecified: Secondary | ICD-10-CM | POA: Diagnosis not present

## 2023-06-19 DIAGNOSIS — Z7984 Long term (current) use of oral hypoglycemic drugs: Secondary | ICD-10-CM | POA: Diagnosis not present

## 2023-06-19 DIAGNOSIS — B349 Viral infection, unspecified: Secondary | ICD-10-CM | POA: Insufficient documentation

## 2023-06-19 DIAGNOSIS — R112 Nausea with vomiting, unspecified: Secondary | ICD-10-CM | POA: Diagnosis present

## 2023-06-19 DIAGNOSIS — Z20822 Contact with and (suspected) exposure to covid-19: Secondary | ICD-10-CM | POA: Diagnosis not present

## 2023-06-19 DIAGNOSIS — I2510 Atherosclerotic heart disease of native coronary artery without angina pectoris: Secondary | ICD-10-CM | POA: Insufficient documentation

## 2023-06-19 DIAGNOSIS — I10 Essential (primary) hypertension: Secondary | ICD-10-CM | POA: Insufficient documentation

## 2023-06-19 LAB — CBC WITH DIFFERENTIAL/PLATELET
Abs Immature Granulocytes: 0.07 10*3/uL (ref 0.00–0.07)
Basophils Absolute: 0 10*3/uL (ref 0.0–0.1)
Basophils Relative: 0 %
Eosinophils Absolute: 0.1 10*3/uL (ref 0.0–0.5)
Eosinophils Relative: 1 %
HCT: 47.8 % (ref 39.0–52.0)
Hemoglobin: 15.4 g/dL (ref 13.0–17.0)
Immature Granulocytes: 1 %
Lymphocytes Relative: 3 %
Lymphs Abs: 0.4 10*3/uL — ABNORMAL LOW (ref 0.7–4.0)
MCH: 31.3 pg (ref 26.0–34.0)
MCHC: 32.2 g/dL (ref 30.0–36.0)
MCV: 97.2 fL (ref 80.0–100.0)
Monocytes Absolute: 0.4 10*3/uL (ref 0.1–1.0)
Monocytes Relative: 3 %
Neutro Abs: 12 10*3/uL — ABNORMAL HIGH (ref 1.7–7.7)
Neutrophils Relative %: 92 %
Platelets: 236 10*3/uL (ref 150–400)
RBC: 4.92 MIL/uL (ref 4.22–5.81)
RDW: 13.7 % (ref 11.5–15.5)
WBC: 12.9 10*3/uL — ABNORMAL HIGH (ref 4.0–10.5)
nRBC: 0 % (ref 0.0–0.2)

## 2023-06-19 LAB — COMPREHENSIVE METABOLIC PANEL
ALT: 20 U/L (ref 0–44)
AST: 38 U/L (ref 15–41)
Albumin: 3.4 g/dL — ABNORMAL LOW (ref 3.5–5.0)
Alkaline Phosphatase: 59 U/L (ref 38–126)
Anion gap: 13 (ref 5–15)
BUN: 18 mg/dL (ref 8–23)
CO2: 25 mmol/L (ref 22–32)
Calcium: 9 mg/dL (ref 8.9–10.3)
Chloride: 96 mmol/L — ABNORMAL LOW (ref 98–111)
Creatinine, Ser: 1.26 mg/dL — ABNORMAL HIGH (ref 0.61–1.24)
GFR, Estimated: 59 mL/min — ABNORMAL LOW (ref >60)
Glucose, Bld: 275 mg/dL — ABNORMAL HIGH (ref 70–99)
Potassium: 4.2 mmol/L (ref 3.5–5.1)
Sodium: 134 mmol/L — ABNORMAL LOW (ref 135–145)
Total Bilirubin: 0.9 mg/dL (ref 0.0–1.2)
Total Protein: 8 g/dL (ref 6.5–8.1)

## 2023-06-19 NOTE — ED Triage Notes (Signed)
Pt BIB [facility] for 3 days of URI sxs, fever, vomiting  114HR 97%4L 116/60 38 RR 296 cbg

## 2023-06-19 NOTE — ED Provider Triage Note (Signed)
 Emergency Medicine Provider Triage Evaluation Note  Richard Reid , a 78 y.o. male  was evaluated in triage.  Pt complains of cough, fevers, body aches, vomiting for 3 days. Denies chest pain or shortness of breath  Review of Systems  Positive: As above Negative: As above  Physical Exam  BP 127/72   Pulse (!) 114   Temp 99.1 F (37.3 C) (Oral)   Resp 17   Ht 5' 9.5 (1.765 m)   Wt 115.2 kg   SpO2 97%   BMI 36.97 kg/m  Gen:   Awake, no distress   Resp:  Normal effort  MSK:   Moves extremities without difficulty    Medical Decision Making  Medically screening exam initiated at 9:00 PM.  Appropriate orders placed.  Richard Reid was informed that the remainder of the evaluation will be completed by another provider, this initial triage assessment does not replace that evaluation, and the importance of remaining in the ED until their evaluation is complete.     Veta Palma, PA-C 06/19/23 2101

## 2023-06-20 ENCOUNTER — Emergency Department (HOSPITAL_COMMUNITY): Payer: Medicare Other

## 2023-06-20 LAB — URINALYSIS, W/ REFLEX TO CULTURE (INFECTION SUSPECTED)
Bacteria, UA: NONE SEEN
Bilirubin Urine: NEGATIVE
Glucose, UA: NEGATIVE mg/dL
Hgb urine dipstick: NEGATIVE
Ketones, ur: 5 mg/dL — AB
Leukocytes,Ua: NEGATIVE
Nitrite: NEGATIVE
Protein, ur: 30 mg/dL — AB
Specific Gravity, Urine: 1.026 (ref 1.005–1.030)
pH: 5 (ref 5.0–8.0)

## 2023-06-20 LAB — RESP PANEL BY RT-PCR (RSV, FLU A&B, COVID)  RVPGX2
Influenza A by PCR: NEGATIVE
Influenza B by PCR: NEGATIVE
Resp Syncytial Virus by PCR: NEGATIVE
SARS Coronavirus 2 by RT PCR: NEGATIVE

## 2023-06-20 MED ORDER — ONDANSETRON 4 MG PO TBDP
4.0000 mg | ORAL_TABLET | Freq: Three times a day (TID) | ORAL | 0 refills | Status: AC | PRN
Start: 2023-06-20 — End: ?

## 2023-06-20 MED ORDER — IOHEXOL 350 MG/ML SOLN
75.0000 mL | Freq: Once | INTRAVENOUS | Status: AC | PRN
  Administered 2023-06-20: 75 mL via INTRAVENOUS

## 2023-06-20 NOTE — Discharge Instructions (Signed)
 You were seen today for nausea, vomiting, shortness of breath.  Your workup is really reassuring.  Your CT scan does indicate likely some gastroenteritis which is usually viral in nature.

## 2023-06-20 NOTE — ED Provider Notes (Signed)
 [place] [facility] AT [facility] Provider Note   CSN: [id number] Arrival date & time: 06/19/23  2021     History  Chief Complaint  Patient presents with   Emesis   Fever    Richard Reid is a 78 y.o. male.  HPI     This is a 78 year old male who presents with reported nausea, vomiting, fever.  When I initially interviewed the patient he stated he had no complaints.  He was not sure why he was here.  He was oriented x 3.  Denies chest pain, abdominal pain, shortness of breath.  He is significantly hard of hearing.  I spoke briefly with the patient's wife.  Reports that he has had a fever at home as well as cough.  He is not on home oxygen.  Home Medications Prior to Admission medications   Medication Sig Start Date End Date Taking? Authorizing Provider  ondansetron  (ZOFRAN -ODT) 4 MG disintegrating tablet Take 1 tablet (4 mg total) by mouth every 8 (eight) hours as needed. 06/20/23  Yes Juliya Magill, Charmaine FALCON, MD  ciprofloxacin  (CIPRO ) 500 MG tablet Take 1 tablet (500 mg total) by mouth every 12 (twelve) hours. Patient not taking: Reported on 04/20/2021 08/18/17   Levander Houston, MD  furosemide  (LASIX ) 40 MG tablet Take 40 mg by mouth daily. 04/05/21   [provider]  glipiZIDE (GLUCOTROL) 10 MG tablet Take 10 mg by mouth 2 (two) times daily. 04/05/21   [provider]  hydrochlorothiazide (HYDRODIURIL) 25 MG tablet Take 25 mg by mouth daily.    [provider]  levothyroxine  (SYNTHROID ) 25 MCG tablet Take 25 mcg by mouth daily. 02/18/21   [provider]  lisinopril (PRINIVIL,ZESTRIL) 20 MG tablet Take 20 mg by mouth daily.    [provider]  metFORMIN (GLUCOPHAGE) 1000 MG tablet Take 1,000 mg by mouth 2 (two) times daily. 04/05/21   [provider]  metoprolol  succinate (TOPROL -XL) 100 MG 24 hr tablet Take 100 mg by mouth daily. Take with or immediately following a meal.    [provider]   nitroGLYCERIN  (NITROSTAT ) 0.4 MG SL tablet Place 0.4 mg under the tongue every 5 (five) minutes as needed for chest pain.    [provider]  ondansetron  (ZOFRAN  ODT) 8 MG disintegrating tablet Take 1 tablet (8 mg total) by mouth every 8 (eight) hours as needed for nausea or vomiting. Patient not taking: Reported on 04/20/2021 08/18/17   Levander Houston, MD  oxyCODONE -acetaminophen  (PERCOCET/ROXICET) 5-325 MG tablet Take 1 tablet by mouth every 4 (four) hours as needed for severe pain. Patient not taking: Reported on 04/20/2021 08/18/17   Levander Houston, MD  simvastatin  (ZOCOR ) 20 MG tablet Take 1 tablet (20 mg total) by mouth at bedtime. 02/28/13   Jordan, Peter M, MD      Allergies    Morphine and codeine    Review of Systems   Review of Systems  Constitutional:  Positive for fever.  Respiratory:  Positive for cough and shortness of breath.   Gastrointestinal:  Positive for nausea and vomiting.  All other systems reviewed and are negative.   Physical Exam Updated Vital Signs BP 127/87   Pulse 93   Temp 98.5 F (36.9 C) (Oral)   Resp 20   Ht 1.765 m (5' 9.5)   Wt 115.2 kg   SpO2 97%   BMI 36.97 kg/m  Physical Exam Vitals and nursing note reviewed.  Constitutional:      Appearance: He  is well-developed. He is obese. He is not ill-appearing.  HENT:     Head: Normocephalic and atraumatic.  Eyes:     Pupils: Pupils are equal, round, and reactive to light.  Cardiovascular:     Rate and Rhythm: Normal rate and regular rhythm.     Heart sounds: Normal heart sounds. No murmur heard. Pulmonary:     Effort: Pulmonary effort is normal. No respiratory distress.     Breath sounds: Normal breath sounds. No wheezing.  Abdominal:     General: Bowel sounds are normal.     Palpations: Abdomen is soft.     Tenderness: There is abdominal tenderness. There is no rebound.  Musculoskeletal:     Cervical back: Neck supple.  Lymphadenopathy:     Cervical: No cervical adenopathy.   Skin:    General: Skin is warm and dry.  Neurological:     Mental Status: He is alert and oriented to person, place, and time.  Psychiatric:        Mood and Affect: Mood normal.     ED Results / Procedures / Treatments   Labs (all labs ordered are listed, but only abnormal results are displayed) Labs Reviewed  COMPREHENSIVE METABOLIC PANEL - Abnormal; Notable for the following components:      Result Value   Sodium 134 (*)    Chloride 96 (*)    Glucose, Bld 275 (*)    Creatinine, Ser 1.26 (*)    Albumin 3.4 (*)    GFR, Estimated 59 (*)    All other components within normal limits  CBC WITH DIFFERENTIAL/PLATELET - Abnormal; Notable for the following components:   WBC 12.9 (*)    Neutro Abs 12.0 (*)    Lymphs Abs 0.4 (*)    All other components within normal limits  URINALYSIS, W/ REFLEX TO CULTURE (INFECTION SUSPECTED) - Abnormal; Notable for the following components:   Color, Urine AMBER (*)    Ketones, ur 5 (*)    Protein, ur 30 (*)    All other components within normal limits  RESP PANEL BY RT-PCR (RSV, FLU A&B, COVID)  [id number]    EKG None  Radiology CT ABDOMEN PELVIS W CONTRAST Result Date: 06/20/2023 CLINICAL DATA:  78 year old male with abdominal pain. EXAM: CT ABDOMEN AND PELVIS WITH CONTRAST TECHNIQUE: Multidetector CT imaging of the abdomen and pelvis was performed using the standard protocol following bolus administration of intravenous contrast. RADIATION DOSE REDUCTION: This exam was performed according to the departmental dose-optimization program which includes automated exposure control, adjustment of the mA and/or kV according to patient size and/or use of iterative reconstruction technique. CONTRAST:  75mL OMNIPAQUE  IOHEXOL  350 MG/ML SOLN COMPARISON:  Noncontrast CT Abdomen and Pelvis [facility] 09/25/2017. FINDINGS: Lower chest: Lower lung volumes. Coronary artery and mild pericardial calcifications. No pericardial effusion. Heart size  remains normal. Mild to moderate bilateral lung base atelectasis. No pleural effusion. Hepatobiliary: Possible Hepatic steatosis and punctate cholelithiasis (series 3, image 35). But otherwise negative liver and gallbladder. No bile duct enlargement. Pancreas: Negative. Spleen: Negative. Adrenals/Urinary Tract: Normal adrenal glands. Nonobstructed kidneys with symmetric renal enhancement and contrast excretion. No nephrolithiasis identified now. Decompressed and normal ureters. Unremarkable bladder. Stomach/Bowel: Redundant large bowel with retained stool from the splenic flexure to the rectum. Fluid in the transverse colon and right colon. No superimposed large bowel wall thickening. Appendix is diminutive and normal on coronal image 64. Terminal ileum is nondilated. Upstream small bowel is nondilated and the volume of small bowel  fluid is average to mildly above average. Stomach and duodenum are decompressed. No free air, free fluid, or focal mesenteric inflammation is identified. Vascular/Lymphatic: Aortoiliac calcified atherosclerosis. Abdominal aortic caliber within normal limits. Major arterial structures and the portal venous system appear patent. No lymphadenopathy identified. Reproductive: Negative. Other: No pelvis free fluid. Musculoskeletal: Lower thoracic Diffuse idiopathic skeletal hyperostosis (DISH). With interbody ankylosis of several levels. Superimposed occasional severe disc degeneration and vacuum disc. No acute osseous abnormality identified. IMPRESSION: 1. Fluid in nondilated large bowel and in the large intestine through the transverse colon might indicate Enteritis/Diarrhea. Normal appendix and no discrete bowel inflammation. Retained stool in redundant distal large bowel. 2. Lower lung volumes with bilateral lung base atelectasis. 3. Questionable cholelithiasis, hepatic steatosis. 4.  Aortic Atherosclerosis (ICD10-I70.0). Electronically Signed   By: VEAR Hurst M.D.   On: 06/20/2023 04:07    DG Chest 2 View Result Date: 06/19/2023 CLINICAL DATA:  Suspected sepsis, vomiting, and fever. EXAM: CHEST - 2 VIEW COMPARISON:  PA and lateral chest 04/20/2021 FINDINGS: There is a low inspiration on exam limiting evaluation of the bases. The heart appears normal in size as well as can be seen. The mediastinum is stable with aortic tortuosity and atherosclerosis. The hypoexpanded lungs appear generally clear. No pleural effusion is seen. There is thoracic spondylosis and mild kyphosis. Partially visible lower cervical ACDF plating. IMPRESSION: Low inspiration on exam limiting evaluation of the bases. No evidence of acute chest disease. Aortic atherosclerosis. Electronically Signed   By: Francis Quam M.D.   On: 06/19/2023 21:32    Procedures Procedures    Medications Ordered in ED Medications  iohexol  (OMNIPAQUE ) 350 MG/ML injection 75 mL (75 mLs Intravenous Contrast Given 06/20/23 0331)    ED Course/ Medical Decision Making/ A&P                                 Medical Decision Making Amount and/or Complexity of Data Reviewed Labs: ordered. Radiology: ordered.  Risk Prescription drug management.   This patient presents to the ED for concern of nausea, vomiting, this involves an extensive number of treatment options, and is a complaint that carries with it a high risk of complications and morbidity.  I considered the following differential and admission for this acute, potentially life threatening condition.  The differential diagnosis includes illness such as COVID or influenza, gastroenteritis, gastritis, pancreatitis, cholecystitis, pneumonia  MDM:    This is a 78 year old male who presents with nausea, vomiting, shortness of breath.  He has no complaints for me on exam.  He is very hard of hearing.  Currently on oxygen but has weaned off.  Not on home oxygen.  No respiratory distress.  No wheezing.  Chest x-ray without obvious pneumonia.  COVID and influenza negative.  Basic lab  work is largely reassuring.  Given age and abdominal complaints, CT scan was obtained.  CT is consistent most likely with enteritis which would fit his clinical picture.  He has been hemodynamically stable.  He is able to tolerate p.o.  He is not requiring any oxygen.  Will discharge with Zofran .  (Labs, imaging, consults)  Labs: I Ordered, and personally interpreted labs.  The pertinent results include: CBC, CMP, COVID, influenza, urinalysis  Imaging Studies ordered: I ordered imaging studies including CT abdomen and pelvis I independently visualized and interpreted imaging. I agree with the radiologist interpretation  Additional history obtained from chart review.  External records from  outside source obtained and reviewed including prior evaluations  Cardiac Monitoring: The patient was maintained on a cardiac monitor.  If on the cardiac monitor, I personally viewed and interpreted the cardiac monitored which showed an underlying rhythm of: Sinus  Reevaluation: After the interventions noted above, I reevaluated the patient and found that they have :resolved  Social Determinants of Health:  lives with wife  Disposition: Discharge  Co morbidities that complicate the patient evaluation  Past Medical History:  Diagnosis Date   Coronary artery disease    LHC 12/2000:  mLAD stent ok, dLAD 95% at apex, very small intermediate 95% at origin, CFX < 20%, dRCA < 20%, EF 65%.  -  med Rx   Glucose intolerance (impaired glucose tolerance)    Hyperlipidemia    Hypertension    Obesity      Medicines Meds ordered this encounter  Medications   iohexol  (OMNIPAQUE ) 350 MG/ML injection 75 mL   ondansetron  (ZOFRAN -ODT) 4 MG disintegrating tablet    Sig: Take 1 tablet (4 mg total) by mouth every 8 (eight) hours as needed.    Dispense:  20 tablet    Refill:  0    I have reviewed the patients home medicines and have made adjustments as needed  Problem List / ED Course: Problem List Items  Addressed This Visit   None Visit Diagnoses       Gastroenteritis    -  Primary     Viral illness                       Final Clinical Impression(s) / ED Diagnoses Final diagnoses:  Gastroenteritis  Viral illness    Rx / DC Orders ED Discharge Orders          Ordered    ondansetron  (ZOFRAN -ODT) 4 MG disintegrating tablet  Every 8 hours PRN        06/20/23 0515              Bari Charmaine FALCON, MD 06/20/23 [phone]

## 2023-06-20 NOTE — ED Notes (Signed)
 This RN contacted pt wife Aurora Blowers to notify her of pt d/c, says she will arrive to take him home in roughly 30 mins

## 2023-06-20 NOTE — ED Notes (Signed)
 Pt refused to change into gown

## 2023-11-16 ENCOUNTER — Emergency Department (HOSPITAL_COMMUNITY)

## 2023-11-16 ENCOUNTER — Encounter (HOSPITAL_COMMUNITY): Payer: Self-pay

## 2023-11-16 ENCOUNTER — Encounter (HOSPITAL_COMMUNITY)
Admission: EM | Disposition: A | Discharge status: Home Health | Payer: Self-pay | Source: Home / Self Care | Attending: Cardiovascular Disease

## 2023-11-16 ENCOUNTER — Inpatient Hospital Stay (HOSPITAL_COMMUNITY)

## 2023-11-16 ENCOUNTER — Inpatient Hospital Stay (HOSPITAL_COMMUNITY)
Admission: EM | Admit: 2023-11-16 | Discharge: 2023-11-19 | DRG: 321 | Disposition: A | Discharge status: Home Health | Attending: Cardiovascular Disease | Admitting: Cardiovascular Disease

## 2023-11-16 ENCOUNTER — Other Ambulatory Visit: Payer: Self-pay

## 2023-11-16 DIAGNOSIS — Z885 Allergy status to narcotic agent status: Secondary | ICD-10-CM

## 2023-11-16 DIAGNOSIS — E782 Mixed hyperlipidemia: Secondary | ICD-10-CM | POA: Diagnosis present

## 2023-11-16 DIAGNOSIS — I213 ST elevation (STEMI) myocardial infarction of unspecified site: Secondary | ICD-10-CM | POA: Diagnosis present

## 2023-11-16 DIAGNOSIS — I2111 ST elevation (STEMI) myocardial infarction involving right coronary artery: Secondary | ICD-10-CM | POA: Diagnosis present

## 2023-11-16 DIAGNOSIS — Z79899 Other long term (current) drug therapy: Secondary | ICD-10-CM | POA: Diagnosis not present

## 2023-11-16 DIAGNOSIS — I2119 ST elevation (STEMI) myocardial infarction involving other coronary artery of inferior wall: Principal | ICD-10-CM | POA: Diagnosis present

## 2023-11-16 DIAGNOSIS — I252 Old myocardial infarction: Secondary | ICD-10-CM | POA: Diagnosis present

## 2023-11-16 DIAGNOSIS — I2510 Atherosclerotic heart disease of native coronary artery without angina pectoris: Secondary | ICD-10-CM | POA: Diagnosis present

## 2023-11-16 DIAGNOSIS — E669 Obesity, unspecified: Secondary | ICD-10-CM | POA: Diagnosis present

## 2023-11-16 DIAGNOSIS — T82855A Stenosis of coronary artery stent, initial encounter: Secondary | ICD-10-CM | POA: Diagnosis present

## 2023-11-16 DIAGNOSIS — Z79890 Hormone replacement therapy (postmenopausal): Secondary | ICD-10-CM | POA: Diagnosis not present

## 2023-11-16 DIAGNOSIS — Z955 Presence of coronary angioplasty implant and graft: Secondary | ICD-10-CM

## 2023-11-16 DIAGNOSIS — Y831 Surgical operation with implant of artificial internal device as the cause of abnormal reaction of the patient, or of later complication, without mention of misadventure at the time of the procedure: Secondary | ICD-10-CM | POA: Diagnosis present

## 2023-11-16 DIAGNOSIS — I10 Essential (primary) hypertension: Secondary | ICD-10-CM | POA: Diagnosis present

## 2023-11-16 DIAGNOSIS — Z7984 Long term (current) use of oral hypoglycemic drugs: Secondary | ICD-10-CM

## 2023-11-16 DIAGNOSIS — Z6835 Body mass index (BMI) 35.0-35.9, adult: Secondary | ICD-10-CM

## 2023-11-16 DIAGNOSIS — E872 Acidosis, unspecified: Secondary | ICD-10-CM | POA: Diagnosis present

## 2023-11-16 DIAGNOSIS — R570 Cardiogenic shock: Secondary | ICD-10-CM | POA: Diagnosis present

## 2023-11-16 DIAGNOSIS — Z8249 Family history of ischemic heart disease and other diseases of the circulatory system: Secondary | ICD-10-CM | POA: Diagnosis not present

## 2023-11-16 DIAGNOSIS — R54 Age-related physical debility: Secondary | ICD-10-CM | POA: Diagnosis present

## 2023-11-16 DIAGNOSIS — E039 Hypothyroidism, unspecified: Secondary | ICD-10-CM | POA: Diagnosis present

## 2023-11-16 DIAGNOSIS — Z87891 Personal history of nicotine dependence: Secondary | ICD-10-CM | POA: Diagnosis not present

## 2023-11-16 DIAGNOSIS — I5021 Acute systolic (congestive) heart failure: Secondary | ICD-10-CM | POA: Diagnosis not present

## 2023-11-16 DIAGNOSIS — E119 Type 2 diabetes mellitus without complications: Secondary | ICD-10-CM | POA: Diagnosis present

## 2023-11-16 HISTORY — PX: RIGHT/LEFT HEART CATH AND CORONARY ANGIOGRAPHY: CATH118266

## 2023-11-16 HISTORY — PX: CORONARY/GRAFT ACUTE MI REVASCULARIZATION: CATH118305

## 2023-11-16 LAB — CBC WITH DIFFERENTIAL/PLATELET
Abs Immature Granulocytes: 0.06 K/uL (ref 0.00–0.07)
Basophils Absolute: 0.1 K/uL (ref 0.0–0.1)
Basophils Relative: 1 %
Eosinophils Absolute: 0.4 K/uL (ref 0.0–0.5)
Eosinophils Relative: 3 %
HCT: 44.4 % (ref 39.0–52.0)
Hemoglobin: 14.4 g/dL (ref 13.0–17.0)
Immature Granulocytes: 1 %
Lymphocytes Relative: 20 %
Lymphs Abs: 2.4 K/uL (ref 0.7–4.0)
MCH: 31.9 pg (ref 26.0–34.0)
MCHC: 32.4 g/dL (ref 30.0–36.0)
MCV: 98.4 fL (ref 80.0–100.0)
Monocytes Absolute: 0.7 K/uL (ref 0.1–1.0)
Monocytes Relative: 6 %
Neutro Abs: 8.1 K/uL — ABNORMAL HIGH (ref 1.7–7.7)
Neutrophils Relative %: 69 %
Platelets: 247 K/uL (ref 150–400)
RBC: 4.51 MIL/uL (ref 4.22–5.81)
RDW: 13.2 % (ref 11.5–15.5)
WBC: 11.7 K/uL — ABNORMAL HIGH (ref 4.0–10.5)
nRBC: 0 % (ref 0.0–0.2)

## 2023-11-16 LAB — COMPREHENSIVE METABOLIC PANEL WITH GFR
ALT: 13 U/L (ref 0–44)
AST: 27 U/L (ref 15–41)
Albumin: 3.2 g/dL — ABNORMAL LOW (ref 3.5–5.0)
Alkaline Phosphatase: 53 U/L (ref 38–126)
Anion gap: 13 (ref 5–15)
BUN: 19 mg/dL (ref 8–23)
CO2: 25 mmol/L (ref 22–32)
Calcium: 9.4 mg/dL (ref 8.9–10.3)
Chloride: 97 mmol/L — ABNORMAL LOW (ref 98–111)
Creatinine, Ser: 0.97 mg/dL (ref 0.61–1.24)
GFR, Estimated: >60 mL/min (ref >60)
Glucose, Bld: 255 mg/dL — ABNORMAL HIGH (ref 70–99)
Potassium: 3.4 mmol/L — ABNORMAL LOW (ref 3.5–5.1)
Sodium: 135 mmol/L (ref 135–145)
Total Bilirubin: 0.6 mg/dL (ref 0.0–1.2)
Total Protein: 7 g/dL (ref 6.5–8.1)

## 2023-11-16 LAB — POCT ACTIVATED CLOTTING TIME
Activated Clotting Time: 239 s
Activated Clotting Time: 291 s
Activated Clotting Time: 210 s
Activated Clotting Time: 187 s
Activated Clotting Time: 147 s

## 2023-11-16 LAB — POCT I-STAT EG7
Acid-Base Excess: 0 mmol/L (ref 0.0–2.0)
Acid-base deficit: 1 mmol/L (ref 0.0–2.0)
Bicarbonate: 26.4 mmol/L (ref 20.0–28.0)
Bicarbonate: 27.6 mmol/L (ref 20.0–28.0)
Calcium, Ion: 1.13 mmol/L — ABNORMAL LOW (ref 1.15–1.40)
Calcium, Ion: 1.19 mmol/L (ref 1.15–1.40)
HCT: 41 % (ref 39.0–52.0)
HCT: 43 % (ref 39.0–52.0)
Hemoglobin: 13.9 g/dL (ref 13.0–17.0)
Hemoglobin: 14.6 g/dL (ref 13.0–17.0)
O2 Saturation: 67 %
O2 Saturation: 67 %
Potassium: 3.7 mmol/L (ref 3.5–5.1)
Potassium: 3.9 mmol/L (ref 3.5–5.1)
Sodium: 136 mmol/L (ref 135–145)
Sodium: 137 mmol/L (ref 135–145)
TCO2: 28 mmol/L (ref 22–32)
TCO2: 29 mmol/L (ref 22–32)
pCO2, Ven: 53.9 mmHg (ref 44–60)
pCO2, Ven: 55.2 mmHg (ref 44–60)
pH, Ven: 7.298 (ref 7.25–7.43)
pH, Ven: 7.307 (ref 7.25–7.43)
pO2, Ven: 39 mmHg (ref 32–45)
pO2, Ven: 39 mmHg (ref 32–45)

## 2023-11-16 LAB — LIPID PANEL
Cholesterol: 139 mg/dL (ref 0–200)
HDL: 30 mg/dL — ABNORMAL LOW (ref >40)
LDL Cholesterol: 59 mg/dL (ref 0–99)
Total CHOL/HDL Ratio: 4.6 ratio
Triglycerides: 248 mg/dL — ABNORMAL HIGH (ref <150)
VLDL: 50 mg/dL — ABNORMAL HIGH (ref 0–40)

## 2023-11-16 LAB — APTT: aPTT: 159 s — ABNORMAL HIGH (ref 24–36)

## 2023-11-16 LAB — MRSA NEXT GEN BY PCR, NASAL: MRSA by PCR Next Gen: NOT DETECTED

## 2023-11-16 LAB — POCT I-STAT 7, (LYTES, BLD GAS, ICA,H+H)
Acid-base deficit: 2 mmol/L (ref 0.0–2.0)
Bicarbonate: 24.4 mmol/L (ref 20.0–28.0)
Calcium, Ion: 1.08 mmol/L — ABNORMAL LOW (ref 1.15–1.40)
HCT: 41 % (ref 39.0–52.0)
Hemoglobin: 13.9 g/dL (ref 13.0–17.0)
O2 Saturation: 97 %
Potassium: 3.6 mmol/L (ref 3.5–5.1)
Sodium: 138 mmol/L (ref 135–145)
TCO2: 26 mmol/L (ref 22–32)
pCO2 arterial: 45.8 mmHg (ref 32–48)
pH, Arterial: 7.334 — ABNORMAL LOW (ref 7.35–7.45)
pO2, Arterial: 100 mmHg (ref 83–108)

## 2023-11-16 LAB — I-STAT CHEM 8, ED
BUN: 24 mg/dL — ABNORMAL HIGH (ref 8–23)
Calcium, Ion: 1.1 mmol/L — ABNORMAL LOW (ref 1.15–1.40)
Chloride: 98 mmol/L (ref 98–111)
Creatinine, Ser: 1.1 mg/dL (ref 0.61–1.24)
Glucose, Bld: 260 mg/dL — ABNORMAL HIGH (ref 70–99)
HCT: 46 % (ref 39.0–52.0)
Hemoglobin: 15.6 g/dL (ref 13.0–17.0)
Potassium: 3.7 mmol/L (ref 3.5–5.1)
Sodium: 135 mmol/L (ref 135–145)
TCO2: 27 mmol/L (ref 22–32)

## 2023-11-16 LAB — CG4 I-STAT (LACTIC ACID): Lactic Acid, Venous: 2.3 mmol/L (ref 0.5–1.9)

## 2023-11-16 LAB — ECHOCARDIOGRAM COMPLETE
AR max vel: 2.22 cm2
AV Area VTI: 2.03 cm2
AV Area mean vel: 2.22 cm2
AV Mean grad: 7 mmHg
AV Peak grad: 11.7 mmHg
Ao pk vel: 1.71 m/s
Area-P 1/2: 3.08 cm2
Height: 69 in
Weight: 3872 [oz_av]

## 2023-11-16 LAB — TSH: TSH: 3.826 u[IU]/mL (ref 0.350–4.500)

## 2023-11-16 LAB — I-STAT CG4 LACTIC ACID, ED: Lactic Acid, Venous: 3.5 mmol/L (ref 0.5–1.9)

## 2023-11-16 LAB — TROPONIN I (HIGH SENSITIVITY)
Troponin I (High Sensitivity): 137 ng/L (ref <18)
Troponin I (High Sensitivity): 3293 ng/L (ref <18)

## 2023-11-16 LAB — HEMOGLOBIN A1C
Hgb A1c MFr Bld: 7 % — ABNORMAL HIGH (ref 4.8–5.6)
Mean Plasma Glucose: 154.2 mg/dL

## 2023-11-16 LAB — GLUCOSE, CAPILLARY
Glucose-Capillary: 236 mg/dL — ABNORMAL HIGH (ref 70–99)
Glucose-Capillary: 129 mg/dL — ABNORMAL HIGH (ref 70–99)
Glucose-Capillary: 149 mg/dL — ABNORMAL HIGH (ref 70–99)

## 2023-11-16 LAB — PROTIME-INR
INR: 1.2 (ref 0.8–1.2)
Prothrombin Time: 15.6 s — ABNORMAL HIGH (ref 11.4–15.2)

## 2023-11-16 SURGERY — CORONARY/GRAFT ACUTE MI REVASCULARIZATION
Anesthesia: Moderate Sedation

## 2023-11-16 MED ORDER — VERAPAMIL HCL 2.5 MG/ML IV SOLN
INTRAVENOUS | Status: DC | PRN
  Administered 2023-11-16: 10 mL via INTRA_ARTERIAL

## 2023-11-16 MED ORDER — HEPARIN SODIUM (PORCINE) 1000 UNIT/ML IJ SOLN
INTRAMUSCULAR | Status: AC
  Filled 2023-11-16: qty 10

## 2023-11-16 MED ORDER — TICAGRELOR 90 MG PO TABS
90.0000 mg | ORAL_TABLET | Freq: Two times a day (BID) | ORAL | Status: DC
  Administered 2023-11-16 – 2023-11-19 (×6): 90 mg via ORAL
  Filled 2023-11-16 (×6): qty 1

## 2023-11-16 MED ORDER — HYDRALAZINE HCL 20 MG/ML IJ SOLN: 10.0000 mg | INTRAMUSCULAR | Status: AC | PRN

## 2023-11-16 MED ORDER — LIDOCAINE HCL (PF) 1 % IJ SOLN
INTRAMUSCULAR | Status: DC | PRN
  Administered 2023-11-16: 2 mL via INTRADERMAL
  Administered 2023-11-16: 5 mL via INTRADERMAL

## 2023-11-16 MED ORDER — INSULIN ASPART 100 UNIT/ML IJ SOLN
0.0000 [IU] | Freq: Three times a day (TID) | INTRAMUSCULAR | Status: DC
  Administered 2023-11-16: 7 [IU] via SUBCUTANEOUS
  Administered 2023-11-16: 3 [IU] via SUBCUTANEOUS
  Administered 2023-11-17: 4 [IU] via SUBCUTANEOUS
  Administered 2023-11-17 – 2023-11-18 (×4): 3 [IU] via SUBCUTANEOUS
  Administered 2023-11-18: 4 [IU] via SUBCUTANEOUS
  Administered 2023-11-19: 3 [IU] via SUBCUTANEOUS

## 2023-11-16 MED ORDER — ONDANSETRON HCL 4 MG/2ML IJ SOLN: 4.0000 mg | Freq: Four times a day (QID) | INTRAMUSCULAR | Status: DC | PRN

## 2023-11-16 MED ORDER — NITROGLYCERIN 1 MG/10 ML FOR IR/CATH LAB
INTRA_ARTERIAL | Status: AC
  Filled 2023-11-16: qty 10

## 2023-11-16 MED ORDER — ORAL CARE MOUTH RINSE: 15.0000 mL | OROMUCOSAL | Status: DC | PRN

## 2023-11-16 MED ORDER — SODIUM CHLORIDE 0.9% FLUSH
3.0000 mL | Freq: Two times a day (BID) | INTRAVENOUS | Status: DC
  Administered 2023-11-16 – 2023-11-19 (×7): 3 mL via INTRAVENOUS

## 2023-11-16 MED ORDER — ACETAMINOPHEN 325 MG PO TABS
650.0000 mg | ORAL_TABLET | ORAL | Status: DC | PRN
  Administered 2023-11-16 (×2): 650 mg via ORAL
  Filled 2023-11-16 (×2): qty 2

## 2023-11-16 MED ORDER — NOREPINEPHRINE BITARTRATE 1 MG/ML IV SOLN
INTRAVENOUS | Status: AC | PRN
  Administered 2023-11-16: 5 ug/min via INTRAVENOUS

## 2023-11-16 MED ORDER — IOHEXOL 350 MG/ML SOLN
INTRAVENOUS | Status: DC | PRN
  Administered 2023-11-16: 100 mL

## 2023-11-16 MED ORDER — NOREPINEPHRINE 4 MG/250ML-% IV SOLN
0.0000 ug/min | INTRAVENOUS | Status: DC
  Administered 2023-11-16: 2 ug/min via INTRAVENOUS
  Administered 2023-11-17: 5 ug/min via INTRAVENOUS
  Filled 2023-11-16 (×2): qty 250

## 2023-11-16 MED ORDER — HEPARIN SODIUM (PORCINE) 5000 UNIT/ML IJ SOLN
4000.0000 [IU] | Freq: Once | INTRAMUSCULAR | Status: AC
Start: 2023-11-16 — End: 2023-11-16
  Administered 2023-11-16: 4000 [IU] via INTRAVENOUS
  Filled 2023-11-16: qty 0.8

## 2023-11-16 MED ORDER — ATORVASTATIN CALCIUM 80 MG PO TABS
80.0000 mg | ORAL_TABLET | Freq: Every day | ORAL | Status: DC
  Administered 2023-11-16 – 2023-11-19 (×4): 80 mg via ORAL
  Filled 2023-11-16 (×4): qty 1

## 2023-11-16 MED ORDER — SODIUM CHLORIDE 0.9 % IV SOLN
INTRAVENOUS | Status: AC | PRN
  Administered 2023-11-16: 250 mL via INTRAVENOUS

## 2023-11-16 MED ORDER — TIROFIBAN HCL IN NACL 5-0.9 MG/100ML-% IV SOLN
INTRAVENOUS | Status: AC | PRN
  Administered 2023-11-16: .15 ug/kg/min via INTRAVENOUS

## 2023-11-16 MED ORDER — LABETALOL HCL 5 MG/ML IV SOLN: 10.0000 mg | INTRAVENOUS | Status: DC | PRN

## 2023-11-16 MED ORDER — SODIUM CHLORIDE 0.9 % IV SOLN: 250.0000 mL | INTRAVENOUS | Status: AC | PRN

## 2023-11-16 MED ORDER — POTASSIUM CHLORIDE CRYS ER 20 MEQ PO TBCR
40.0000 meq | EXTENDED_RELEASE_TABLET | Freq: Once | ORAL | Status: AC
  Administered 2023-11-16: 40 meq via ORAL
  Filled 2023-11-16: qty 2

## 2023-11-16 MED ORDER — NOREPINEPHRINE 4 MG/250ML-% IV SOLN: 0.0000 ug/min | INTRAVENOUS | Status: DC

## 2023-11-16 MED ORDER — CHLORHEXIDINE GLUCONATE CLOTH 2 % EX PADS
6.0000 | MEDICATED_PAD | Freq: Every day | CUTANEOUS | Status: DC
  Administered 2023-11-16 – 2023-11-19 (×4): 6 via TOPICAL

## 2023-11-16 MED ORDER — LEVOTHYROXINE SODIUM 25 MCG PO TABS
25.0000 ug | ORAL_TABLET | Freq: Every day | ORAL | Status: DC
  Administered 2023-11-17 – 2023-11-19 (×3): 25 ug via ORAL
  Filled 2023-11-16 (×3): qty 1

## 2023-11-16 MED ORDER — VERAPAMIL HCL 2.5 MG/ML IV SOLN
INTRAVENOUS | Status: AC
Start: 2023-11-16 — End: 2023-11-16
  Filled 2023-11-16: qty 2

## 2023-11-16 MED ORDER — HEPARIN SODIUM (PORCINE) 1000 UNIT/ML IJ SOLN
INTRAMUSCULAR | Status: DC | PRN
  Administered 2023-11-16: 12000 [IU] via INTRAVENOUS

## 2023-11-16 MED ORDER — TIROFIBAN HCL IN NACL 5-0.9 MG/100ML-% IV SOLN
INTRAVENOUS | Status: AC
  Filled 2023-11-16: qty 100

## 2023-11-16 MED ORDER — TIROFIBAN (AGGRASTAT) BOLUS VIA INFUSION
INTRAVENOUS | Status: DC | PRN
  Administered 2023-11-16: 2745 ug via INTRAVENOUS

## 2023-11-16 MED ORDER — LIDOCAINE HCL (PF) 1 % IJ SOLN
INTRAMUSCULAR | Status: AC
Start: 2023-11-16 — End: 2023-11-16
  Filled 2023-11-16: qty 30

## 2023-11-16 MED ORDER — PERFLUTREN LIPID MICROSPHERE
1.0000 mL | INTRAVENOUS | Status: AC | PRN
  Administered 2023-11-16: 4 mL via INTRAVENOUS

## 2023-11-16 MED ORDER — TICAGRELOR 90 MG PO TABS
ORAL_TABLET | ORAL | Status: DC | PRN
  Administered 2023-11-16: 180 mg via ORAL

## 2023-11-16 MED ORDER — ASPIRIN 81 MG PO TBEC
81.0000 mg | DELAYED_RELEASE_TABLET | Freq: Every day | ORAL | Status: DC
  Administered 2023-11-17 – 2023-11-19 (×3): 81 mg via ORAL
  Filled 2023-11-16 (×3): qty 1

## 2023-11-16 MED ORDER — SODIUM CHLORIDE 0.9 % IV SOLN: 250.0000 mL | INTRAVENOUS | Status: AC

## 2023-11-16 MED ORDER — SODIUM CHLORIDE 0.9% FLUSH: 3.0000 mL | INTRAVENOUS | Status: DC | PRN

## 2023-11-16 MED ORDER — HEPARIN (PORCINE) IN NACL 1000-0.9 UT/500ML-% IV SOLN
INTRAVENOUS | Status: DC | PRN
  Administered 2023-11-16 (×3): 500 mL

## 2023-11-16 MED ORDER — ENOXAPARIN SODIUM 40 MG/0.4ML IJ SOSY
40.0000 mg | PREFILLED_SYRINGE | INTRAMUSCULAR | Status: DC
  Administered 2023-11-17 – 2023-11-19 (×3): 40 mg via SUBCUTANEOUS
  Filled 2023-11-16 (×3): qty 0.4

## 2023-11-16 MED ORDER — NITROGLYCERIN 1 MG/10 ML FOR IR/CATH LAB
INTRA_ARTERIAL | Status: DC | PRN
  Administered 2023-11-16: 200 ug via INTRACORONARY

## 2023-11-16 MED ORDER — TIROFIBAN HCL IN NACL 5-0.9 MG/100ML-% IV SOLN: 0.1500 ug/kg/min | INTRAVENOUS | Status: DC

## 2023-11-16 MED ORDER — SODIUM CHLORIDE 0.9 % IV SOLN: INTRAVENOUS | Status: AC

## 2023-11-16 SURGICAL SUPPLY — 23 items
BALLOON EMERGE MR 2.5X15 (BALLOONS) IMPLANT
BALLOON SAPPHIRE NC24 3.0X15 (BALLOONS) IMPLANT
BALLOON SAPPHIRE NC24 4.5X12 (BALLOONS) IMPLANT
CATH 5FR JL3.5 JR4 ANG PIG MP (CATHETERS) IMPLANT
CATH EXTRAC PRONTO 5.5F 138CM (CATHETERS) IMPLANT
CATH GUIDELINER COAST (CATHETERS) IMPLANT
CATH INFINITI 5 FR MPA2 (CATHETERS) IMPLANT
CATH LAUNCHER 6FR JR4 (CATHETERS) IMPLANT
CATH SWAN GANZ 7F STRAIGHT (CATHETERS) IMPLANT
DEVICE RAD COMP TR BAND LRG (VASCULAR PRODUCTS) IMPLANT
GLIDESHEATH SLEND SS 6F .021 (SHEATH) IMPLANT
GUIDEWIRE INQWIRE 1.5J.035X260 (WIRE) IMPLANT
KIT ENCORE 26 ADVANTAGE (KITS) IMPLANT
KIT MICROPUNCTURE NIT STIFF (SHEATH) IMPLANT
KIT SYRINGE INJ CVI SPIKEX1 (MISCELLANEOUS) IMPLANT
PACK CARDIAC CATHETERIZATION (CUSTOM PROCEDURE TRAY) ×1 IMPLANT
SET ATX-X65L (MISCELLANEOUS) IMPLANT
SHEATH 6FR 75 DEST SLENDER (SHEATH) IMPLANT
SHEATH PINNACLE 7F 10CM (SHEATH) IMPLANT
SHEATH PROBE COVER 6X72 (BAG) IMPLANT
STENT ONYX FRONTIER 4.5X15 (Permanent Stent) IMPLANT
WIRE ASAHI PROWATER 180CM (WIRE) IMPLANT
WIRE EMERALD 3MM-J .025X260CM (WIRE) IMPLANT

## 2023-11-16 NOTE — Consult Note (Addendum)
 Advanced Heart Failure Team Consult Note   Primary Physician: Norval Kettle, MD Cardiologist:  None  Reason for Consultation: STEMI/Shock   HPI:    Richard Reid is seen today for evaluation of STEMI/Shock at the request of Dr Verlin.   Richard Reid is a 78 year old with a history of CAD, HLD, DMII, hypothyroidism, and HTN.   Woke up this morning with chest pain and shortness of breath. EMS called. EKG concerning for STEMI given ASA in route.   Presented to ED via EMS with chest pain. EKG with ST elevation. Lactic acid 3.5. HS Trop 137 >pending. Taken urgently to the cath lab. Developed hypotension during the case. Norepi started. Underwent PCI/DES to RCA. Preserved cardiac output. Filling pressures were not elevated.   Cath Ost RCA to Prox RCA lesion is 99% stenosed.   Dist LAD-2 lesion is 70% stenosed.   Dist LAD-1 lesion is 70% stenosed. LVEDP 18 CO 4.6 CI 2   Admitted to 2H on Norepi.     Home Medications Prior to Admission medications   Medication Sig Start Date End Date Taking? Authorizing Provider  aspirin  EC 81 MG tablet Take 324 mg by mouth once as needed (chest pain).   Yes [provider]  glipiZIDE (GLUCOTROL) 10 MG tablet Take 10 mg by mouth 2 (two) times daily. 04/05/21  Yes [provider]  hydrochlorothiazide (HYDRODIURIL) 25 MG tablet Take 25 mg by mouth daily.   Yes [provider]  levothyroxine  (SYNTHROID ) 25 MCG tablet Take 25 mcg by mouth daily. 02/18/21  Yes [provider]  lisinopril (PRINIVIL,ZESTRIL) 20 MG tablet Take 20 mg by mouth at bedtime.   Yes [provider]  metFORMIN (GLUCOPHAGE) 1000 MG tablet Take 1,000 mg by mouth 2 (two) times daily. 04/05/21  Yes [provider]  metoprolol  succinate (TOPROL -XL) 100 MG 24 hr tablet Take 100 mg by mouth daily. Take with or immediately following a meal.   Yes [provider]  ondansetron  (ZOFRAN -ODT) 4 MG disintegrating tablet Take 1 tablet  (4 mg total) by mouth every 8 (eight) hours as needed. 06/20/23  Yes Horton, Charmaine FALCON, MD  simvastatin  (ZOCOR ) 20 MG tablet Take 1 tablet (20 mg total) by mouth at bedtime. 02/28/13  Yes [place], Peter M, MD    Past Medical History: Past Medical History:  Diagnosis Date   Coronary artery disease    LHC 12/2000:  mLAD stent ok, dLAD 95% at apex, very small intermediate 95% at origin, CFX < 20%, dRCA < 20%, EF 65%.  -  med Rx   Glucose intolerance (impaired glucose tolerance)    Hyperlipidemia    Hypertension    Obesity     Past Surgical History: Past Surgical History:  Procedure Laterality Date   CARDIAC CATHETERIZATION     STENT TO  MID LAD       Family History: Family History  Problem Relation Age of Onset   Heart disease Father    Heart failure Father     Social History: Social History   Socioeconomic History   Marital status: Married    Spouse name: Not on file   Number of children: Not on file   Years of education: Not on file   Highest education level: Not on file  Occupational History   Not on file  Tobacco Use   Smoking status: Former   Smokeless tobacco: Current    Types: Snuff, Chew  Substance and Sexual Activity   Alcohol  use: No   Drug use: Not Currently   Sexual activity: Not on file  Other Topics Concern   Not on file  Social History Narrative   Not on file   Social Drivers of Health   Financial Resource Strain: Not on file  Food Insecurity: Not on file  Transportation Needs: Not on file  Physical Activity: Not on file  Stress: Not on file  Social Connections: Not on file    Allergies:  Allergies  Allergen Reactions   Morphine And Codeine Other (See Comments)    Skin burns    Objective:    Vital Signs:   Temp:  [97.7 F (36.5 C)] 97.7 F (36.5 C) (07/04 0811) Pulse Rate:  [81-85] 81 (07/04 0830) Resp:  [14-30] 25 (07/04 0830) BP: (99-106)/(62-72) 106/69 (07/04 0830) SpO2:  [88 %-97 %] 96 % (07/04 0906) Weight:  [109.8 kg]  109.8 kg (07/04 0809)    Weight change: Filed Weights   11/16/23 0800 11/16/23 0809  Weight: 109.8 kg 109.8 kg    Intake/Output:  No intake or output data in the 24 hours ending 11/16/23 1059    Physical Exam   General:   No resp difficulty Neck: supple. no JVD.  Cor: PMI nondisplaced. Regular rate & rhythm. No rubs, gallops or murmurs. Lungs: clear Abdomen: soft, nontender, nondistended.  Extremities: no cyanosis, clubbing, rash, edema Neuro: alert & oriented x3   Telemetry   SR  EKG    ED SR with ST elevation inferior leads.   Labs   Basic Metabolic Panel: Recent Labs  Lab 11/16/23 0812 11/16/23 0821  NA 135 135  K 3.4* 3.7  CL 97* 98  CO2 25  --   GLUCOSE 255* 260*  BUN 19 24*  CREATININE 0.97 1.10  CALCIUM  9.4  --     Liver Function Tests: Recent Labs  Lab 11/16/23 0812  AST 27  ALT 13  ALKPHOS 53  BILITOT 0.6  PROT 7.0  ALBUMIN 3.2*   No results for input(s): LIPASE, AMYLASE in the last 168 hours. No results for input(s): AMMONIA in the last 168 hours.  CBC: Recent Labs  Lab 11/16/23 0812 11/16/23 0821  WBC 11.7*  --   NEUTROABS 8.1*  --   HGB 14.4 15.6  HCT 44.4 46.0  MCV 98.4  --   PLT 247  --     Cardiac Enzymes: No results for input(s): CKTOTAL, CKMB, CKMBINDEX, TROPONINI in the last 168 hours.  BNP: BNP (last 3 results) No results for input(s): BNP in the last 8760 hours.  ProBNP (last 3 results) No results for input(s): PROBNP in the last 8760 hours.   CBG: No results for input(s): GLUCAP in the last 168 hours.  Coagulation Studies: Recent Labs    11/16/23 0812  LABPROT 15.6*  INR 1.2     Imaging   DG Chest Port 1 View Result Date: 11/16/2023 CLINICAL DATA:  Chest pain. EXAM: PORTABLE CHEST 1 VIEW COMPARISON:  06/19/2023 FINDINGS: Low volume film. The lungs are clear without focal pneumonia, edema, pneumothorax or pleural effusion. The cardio pericardial silhouette is enlarged. No acute  bony abnormality. Telemetry leads overlie the chest. IMPRESSION: Low volume film without acute cardiopulmonary findings. Electronically Signed   By: Camellia Candle M.D.   On: 11/16/2023 08:22     Medications:     Current Medications:  insulin  aspart  0-20 Units Subcutaneous TID WC   nitroGLYCERIN         Infusions:  sodium chloride   20 mL/hr at 11/16/23 0816   sodium chloride  250 mL (11/16/23 0929)   norepinephrine  (LEVOPHED ) 4 mg in dextrose  5 % 250 mL (0.016 mg/mL) infusion 5 mcg/min (11/16/23 1042)   tirofiban  0.15 mcg/kg/min (11/16/23 0930)      Patient Profile   Admitted with STEMI complicated by shock.   Assessment/Plan  1. STEMI, Inferior-->Cardiogenic Shock  H/O CAD with DES to LAD.  EKG with ST elevation. Lactic Acid 3.5-->2.3. Taken to cath lab and developed hypotension. Started on Norepi. Mechanical support considered but improved with reperfusion.  Cath - RCA 99%, Distal LAD stent with diffuse restenosis no amenable to stent. Nonobstructive circumflex. S/P PTCA/DES to  RCA.  Wean Norepi as able.  Placed on DAPT + statin.  Hold on bb for now with hypotension.  Check Echo.   2. HLD On simvastatin  prior to admit. Started on atorvastatin  80 mg daily   3. Hypothyroid Check Thyroid panel.  4. DMII Continue SSI  HGb A1C 7   Length of Stay: 0  Amy Clegg, NP  11/16/2023, 10:59 AM  Advanced Heart Failure Team Pager [phone] (M-F; 7a - 5p)  Please contact [facility] for night-coverage after hours (4p -7a ) and weekends on [email or web address]  Patient seen with NP, I formulated the plan and agree with the abov enote.   Patient was admitted with acute inferior MI, chest pain began this morning.  Cath showed 99% proximal RCA stenosis with 70% in-stent restenosis distal LAD.  Initial lactate elevated at 3.5 with hypotension, started on norepinephrine  in the cath lab. Patient had DES to proximal RCA with resolution of STE and chest pain.  RHC showed preserved cardiac output  and normal filling pressures post-PCI.  Lactate trended down, most recently 2.3.  Now titrating off NE, currently on 3.  Given rapid improvement, no support device placed.   Patient currently chest pain free, no dyspnea.   General: NAD Neck: No JVD, no thyromegaly or thyroid nodule.  Lungs: Clear to auscultation bilaterally with normal respiratory effort. CV: Nondisplaced PMI.  Heart regular S1/S2, no S3/S4, no murmur.  No peripheral edema.   Abdomen: Soft, nontender, no hepatosplenomegaly, no distention.  Skin: Intact without lesions or rashes.  Neurologic: Alert and oriented x 3.  Psych: Normal affect. Extremities: No clubbing or cyanosis.  HEENT: Normal.   1. CAD: Patient with history of PCI to distal LAD.  Admitted with inferior STEMI, found to have 99% ostial RCA stenosis and 70% diffuse distal LAD stenosis. Now s/p DES to proximal RCA with resolution of STE and chest pain.  - To finish tirofiban  in about 1 hour.  - ASA + Brilinta  - Atorvastatin  80 daily.  2. Cardiogenic shock: Initially lactate was elevated at 3.5 with hypotension requiring NE up to 20.  He recovered rapidly post-PCI to RCA. RHC showed normal filling pressures and preserved cardiac output. Lactate trending down, 2.3 on repeat.  Titrating NE down, now on 3.  - Continue to titrate NE off.  - No need for diuresis at this time.  - Will need echo today, ordered.  3. Type 2 DM: SSI.   Ezra Shuck 11/16/2023 12:08 PM

## 2023-11-16 NOTE — H&P (Signed)
 Cardiology Admission History and Physical   Patient ID: Richard Reid MRN: [id number]; DOB: 01-31-1946   Admission date: 11/16/2023  PCP:  Norval Kettle, MD   [facility] Providers Cardiologist:  [place]  Chief Complaint:  Chest pain   History of Present Illness: Richard Reid is a 78 yo male with history of CAD, HLD, HTN presenting to the ED via EMS with c/o chest pain. EKG with inferior ST elevation. Pt has history of LAD stenting. Having chest pain on arrival to ED. States the chest pain started at 6 am.    Past Medical History:  Diagnosis Date   Coronary artery disease    LHC 12/2000:  mLAD stent ok, dLAD 95% at apex, very small intermediate 95% at origin, CFX < 20%, dRCA < 20%, EF 65%.  -  med Rx   Glucose intolerance (impaired glucose tolerance)    Hyperlipidemia    Hypertension    Obesity    Past Surgical History:  Procedure Laterality Date   CARDIAC CATHETERIZATION     STENT TO  MID LAD        Medications Prior to Admission: Prior to Admission medications   Medication Sig Start Date End Date Taking? Authorizing Provider  ciprofloxacin  (CIPRO ) 500 MG tablet Take 1 tablet (500 mg total) by mouth every 12 (twelve) hours. Patient not taking: Reported on 04/20/2021 08/18/17   Levander Houston, MD  furosemide  (LASIX ) 40 MG tablet Take 40 mg by mouth daily. 04/05/21   [provider]  glipiZIDE (GLUCOTROL) 10 MG tablet Take 10 mg by mouth 2 (two) times daily. 04/05/21   [provider]  hydrochlorothiazide (HYDRODIURIL) 25 MG tablet Take 25 mg by mouth daily.    [provider]  levothyroxine  (SYNTHROID ) 25 MCG tablet Take 25 mcg by mouth daily. 02/18/21   [provider]  lisinopril (PRINIVIL,ZESTRIL) 20 MG tablet Take 20 mg by mouth daily.    [provider]  metFORMIN (GLUCOPHAGE) 1000 MG tablet Take 1,000 mg by mouth 2 (two) times daily. 04/05/21   [provider]  metoprolol  succinate (TOPROL -XL) 100 MG 24 hr  tablet Take 100 mg by mouth daily. Take with or immediately following a meal.    [provider]  nitroGLYCERIN  (NITROSTAT ) 0.4 MG SL tablet Place 0.4 mg under the tongue every 5 (five) minutes as needed for chest pain.    [provider]  ondansetron  (ZOFRAN  ODT) 8 MG disintegrating tablet Take 1 tablet (8 mg total) by mouth every 8 (eight) hours as needed for nausea or vomiting. Patient not taking: Reported on 04/20/2021 08/18/17   Levander Houston, MD  ondansetron  (ZOFRAN -ODT) 4 MG disintegrating tablet Take 1 tablet (4 mg total) by mouth every 8 (eight) hours as needed. 06/20/23   Horton, Charmaine FALCON, MD  oxyCODONE -acetaminophen  (PERCOCET/ROXICET) 5-325 MG tablet Take 1 tablet by mouth every 4 (four) hours as needed for severe pain. Patient not taking: Reported on 04/20/2021 08/18/17   Levander Houston, MD  simvastatin  (ZOCOR ) 20 MG tablet Take 1 tablet (20 mg total) by mouth at bedtime. 02/28/13   [place], Peter M, MD     Allergies:    Allergies  Allergen Reactions   Morphine And Codeine Other (See Comments)    Skin burns    Social History:   Social History   Socioeconomic History   Marital status: Married    Spouse name: Not on file   Number of children: Not on file   Years of education: Not on file  Highest education level: Not on file  Occupational History   Not on file  Tobacco Use   Smoking status: Former   Smokeless tobacco: Current    Types: Snuff, Chew  Substance and Sexual Activity   Alcohol use: No   Drug use: Not Currently   Sexual activity: Not on file  Other Topics Concern   Not on file  Social History Narrative   Not on file   Social Drivers of Health   Financial Resource Strain: Not on file  Food Insecurity: Not on file  Transportation Needs: Not on file  Physical Activity: Not on file  Stress: Not on file  Social Connections: Not on file  Intimate Partner Violence: Not on file     Family History:   The patient's family history includes  Heart disease in his father; Heart failure in his father.    ROS:  Please see the history of present illness.  All other ROS reviewed and negative.     Physical Exam/Data: Vitals:   11/16/23 0813 11/16/23 0815 11/16/23 0817 11/16/23 0830  BP:  99/62  106/69  Pulse: 85 81 83 81  Resp: (!) 24 (!) 29 14 (!) 25  Temp:      TempSrc:      SpO2: 92% (!) 88% 97% 97%  Weight:      Height:       No intake or output data in the 24 hours ending 11/16/23 0843    11/16/2023    8:09 AM 11/16/2023    8:00 AM 06/19/2023    8:47 PM  Last 3 Weights  Weight (lbs) 242 lb 242 lb 254 lb  Weight (kg) 109.77 kg 109.77 kg 115.214 kg     Body mass index is 35.74 kg/m.  General:  Well nourished, well developed, cool, clammy HEENT: normal Neck: no JVD Vascular: No carotid bruits; Distal pulses 2+ bilaterally   Cardiac:  normal S1, S2; RRR; no murmur  Lungs:  clear to auscultation bilaterally, no wheezing, rhonchi or rales  Abd: soft, nontender, no hepatomegaly  Ext: no LE edema Musculoskeletal:  No deformities, BUE and BLE strength normal and equal Skin: warm and dry  Neuro:  CNs 2-12 intact, no focal abnormalities noted Psych:  Normal affect   EKG:  The ECG that was done was personally reviewed and demonstrates sinus and 2 mm inferior ST elevation  Relevant CV Studies:   Laboratory Data: High Sensitivity Troponin:  No results for input(s): TROPONINIHS in the last 720 hours.    Chemistry Recent Labs  Lab 11/16/23 0821  NA 135  K 3.7  CL 98  GLUCOSE 260*  BUN 24*  CREATININE 1.10    No results for input(s): PROT, ALBUMIN, AST, ALT, ALKPHOS, BILITOT in the last 168 hours. Lipids No results for input(s): CHOL, TRIG, HDL, LABVLDL, LDLCALC, CHOLHDL in the last 168 hours. Hematology Recent Labs  Lab 11/16/23 0812 11/16/23 0821  WBC 11.7*  --   RBC 4.51  --   HGB 14.4 15.6  HCT 44.4 46.0  MCV 98.4  --   MCH 31.9  --   MCHC 32.4  --   RDW 13.2  --   PLT 247   --    Thyroid No results for input(s): TSH, FREET4 in the last 168 hours. BNPNo results for input(s): BNP, PROBNP in the last 168 hours.  DDimer No results for input(s): DDIMER in the last 168 hours.  Radiology/Studies:  [facility] Chest Port 1 View Result Date: 11/16/2023  CLINICAL DATA:  Chest pain. EXAM: PORTABLE CHEST 1 VIEW COMPARISON:  06/19/2023 FINDINGS: Low volume film. The lungs are clear without focal pneumonia, edema, pneumothorax or pleural effusion. The cardio pericardial silhouette is enlarged. No acute bony abnormality. Telemetry leads overlie the chest. IMPRESSION: Low volume film without acute cardiopulmonary findings. Electronically Signed   By: Camellia Candle M.D.   On: 11/16/2023 08:22     Assessment and Plan:  Acute inferior STEMI: Plan emergent cardiac cath. Further plans to follow.   Code Status: Full Code  Severity of Illness: The appropriate patient status for this patient is INPATIENT. Inpatient status is judged to be reasonable and necessary in order to provide the required intensity of service to ensure the patient's safety. The patient's presenting symptoms, physical exam findings, and initial radiographic and laboratory data in the context of their chronic comorbidities is felt to place them at high risk for further clinical deterioration. Furthermore, it is not anticipated that the patient will be medically stable for discharge from the hospital within 2 midnights of admission.   * I certify that at the point of admission it is my clinical judgment that the patient will require inpatient hospital care spanning beyond 2 midnights from the point of admission due to high intensity of service, high risk for further deterioration and high frequency of surveillance required.*  For questions or updates, please contact [facility] Please consult [email or web address] for contact info under     Signed, Lonni Cash, MD  11/16/2023 8:43 AM

## 2023-11-16 NOTE — ED Triage Notes (Signed)
 Pt came in as code STEMI. Pt c/o SHOB/CP/Nausea//dizziness that started this morning. Pt has known blockage. EMS gave 324mg  of nitroglycerin  and 4mg  of zofran . Pt arrives axox4. Room air and bed bugs.

## 2023-11-16 NOTE — ED Notes (Signed)
 Pt decontaminated at this time. 4 Bed bugs caught at bedside.

## 2023-11-16 NOTE — ED Notes (Signed)
 Pt placed at 2L of O2, O2 was at 89% room air.

## 2023-11-16 NOTE — ED Provider Notes (Signed)
 [place] EMERGENCY DEPARTMENT AT [facility] Provider Note   CSN: [id number] Arrival date & time: 11/16/23  9198     Patient presents with: Code STEMI   Richard Reid is a 78 y.o. male.   Pt is a 78 yo male with pmhx significant for htn, hld, cad, obesity, and presbycusis.  Pt woke up this am with cp, sob, and cp.  He did have evidence of stemi on EKG, so a code stemi was called in route.  Pt was given 324 mg asa in route.  He was not given nitroglycerin  as his blood pressure was too low.  Pt's O2 sad did drop to 88% on RA, so he was put on 2L oxygen and O2 went up to low 90s.         Prior to Admission medications   Medication Sig Start Date End Date Taking? Authorizing Provider  ciprofloxacin  (CIPRO ) 500 MG tablet Take 1 tablet (500 mg total) by mouth every 12 (twelve) hours. Patient not taking: Reported on 04/20/2021 08/18/17   Levander Houston, MD  furosemide  (LASIX ) 40 MG tablet Take 40 mg by mouth daily. 04/05/21   [provider]  glipiZIDE (GLUCOTROL) 10 MG tablet Take 10 mg by mouth 2 (two) times daily. 04/05/21   [provider]  hydrochlorothiazide (HYDRODIURIL) 25 MG tablet Take 25 mg by mouth daily.    [provider]  levothyroxine  (SYNTHROID ) 25 MCG tablet Take 25 mcg by mouth daily. 02/18/21   [provider]  lisinopril (PRINIVIL,ZESTRIL) 20 MG tablet Take 20 mg by mouth daily.    [provider]  metFORMIN (GLUCOPHAGE) 1000 MG tablet Take 1,000 mg by mouth 2 (two) times daily. 04/05/21   [provider]  metoprolol  succinate (TOPROL -XL) 100 MG 24 hr tablet Take 100 mg by mouth daily. Take with or immediately following a meal.    [provider]  nitroGLYCERIN  (NITROSTAT ) 0.4 MG SL tablet Place 0.4 mg under the tongue every 5 (five) minutes as needed for chest pain.    [provider]  ondansetron  (ZOFRAN  ODT) 8 MG disintegrating tablet Take 1 tablet (8 mg total) by mouth every 8 (eight)  hours as needed for nausea or vomiting. Patient not taking: Reported on 04/20/2021 08/18/17   Levander Houston, MD  ondansetron  (ZOFRAN -ODT) 4 MG disintegrating tablet Take 1 tablet (4 mg total) by mouth every 8 (eight) hours as needed. 06/20/23   Horton, Charmaine FALCON, MD  oxyCODONE -acetaminophen  (PERCOCET/ROXICET) 5-325 MG tablet Take 1 tablet by mouth every 4 (four) hours as needed for severe pain. Patient not taking: Reported on 04/20/2021 08/18/17   Levander Houston, MD  simvastatin  (ZOCOR ) 20 MG tablet Take 1 tablet (20 mg total) by mouth at bedtime. 02/28/13   [place], Peter M, MD    Allergies: Morphine and codeine    Review of Systems  Cardiovascular:  Positive for chest pain.  All other systems reviewed and are negative.   Updated Vital Signs BP 106/69   Pulse 81   Temp 97.7 F (36.5 C) (Oral)   Resp (!) 25   Ht 5' 9 (1.753 m)   Wt 109.8 kg   SpO2 97%   BMI 35.74 kg/m   Physical Exam Vitals and nursing note reviewed.  Constitutional:      Appearance: Normal appearance. He is obese.  HENT:     Head: Normocephalic and atraumatic.     Right Ear: External ear normal.     Left Ear: External ear normal.  Nose: Nose normal.     Mouth/Throat:     Mouth: Mucous membranes are moist.     Pharynx: Oropharynx is clear.  Eyes:     Extraocular Movements: Extraocular movements intact.     Conjunctiva/sclera: Conjunctivae normal.     Pupils: Pupils are equal, round, and reactive to light.  Cardiovascular:     Rate and Rhythm: Normal rate and regular rhythm.     Pulses: Normal pulses.     Heart sounds: Normal heart sounds.  Pulmonary:     Effort: Pulmonary effort is normal.     Breath sounds: Normal breath sounds.  Abdominal:     General: Abdomen is flat. Bowel sounds are normal.     Palpations: Abdomen is soft.  Musculoskeletal:        General: Normal range of motion.     Cervical back: Normal range of motion and neck supple.  Skin:    General: Skin is warm.     Capillary Refill:  Capillary refill takes less than 2 seconds.  Neurological:     General: No focal deficit present.     Mental Status: He is alert and oriented to person, place, and time.  Psychiatric:        Mood and Affect: Mood normal.        Behavior: Behavior normal.     (all labs ordered are listed, but only abnormal results are displayed) Labs Reviewed  CBC WITH DIFFERENTIAL/PLATELET - Abnormal; Notable for the following components:      Result Value   WBC 11.7 (*)    Neutro Abs 8.1 (*)    All other components within normal limits  PROTIME-INR - Abnormal; Notable for the following components:   Prothrombin Time 15.6 (*)    All other components within normal limits  APTT - Abnormal; Notable for the following components:   aPTT 159 (*)    All other components within normal limits  I-STAT CG4 LACTIC ACID, ED - Abnormal; Notable for the following components:   Lactic Acid, Venous 3.5 (*)    All other components within normal limits  I-STAT CHEM 8, ED - Abnormal; Notable for the following components:   BUN 24 (*)    Glucose, Bld 260 (*)    Calcium , Ion 1.10 (*)    All other components within normal limits  HEMOGLOBIN A1C  COMPREHENSIVE METABOLIC PANEL WITH GFR  LIPID PANEL  TROPONIN I (HIGH SENSITIVITY)    EKG: EKG Interpretation Date/Time:  Friday November 16 2023 08:07:55 EDT Ventricular Rate:  84 PR Interval:    QRS Duration:  111 QT Interval:  413 QTC Calculation: 489 R Axis:   110  Text Interpretation: Inferior infarct, acute (RCA) STEMI Confirmed by Dean Clarity [phone]) on 11/16/2023 8:24:56 AM  Radiology: [facility] Chest Port 1 View Result Date: 11/16/2023 CLINICAL DATA:  Chest pain. EXAM: PORTABLE CHEST 1 VIEW COMPARISON:  06/19/2023 FINDINGS: Low volume film. The lungs are clear without focal pneumonia, edema, pneumothorax or pleural effusion. The cardio pericardial silhouette is enlarged. No acute bony abnormality. Telemetry leads overlie the chest. IMPRESSION: Low volume film  without acute cardiopulmonary findings. Electronically Signed   By: Camellia Candle M.D.   On: 11/16/2023 08:22     Procedures   Medications Ordered in the ED  0.9 %  sodium chloride  infusion ( Intravenous New Bag/Given 11/16/23 0816)  nitroGLYCERIN  100 mcg/mL intra-arterial injection (has no administration in time range)  heparin  injection 4,000 Units (4,000 Units Intravenous Given 11/16/23 0812)  Medical Decision Making Amount and/or Complexity of Data Reviewed Radiology: ordered.  Risk Prescription drug management. Decision regarding hospitalization.   This patient presents to the ED for concern of cp, this involves an extensive number of treatment options, and is a complaint that carries with it a high risk of complications and morbidity.  The differential diagnosis includes stemi, nstemi, pulm, gi   Co morbidities that complicate the patient evaluation  htn, hld, cad, obesity, and presbycusis.  Pt woke up this am with cp, sob, and cp   Additional history obtained:  Additional history obtained from epic chart review External records from outside source obtained and reviewed including EMS report   Lab Tests:  I Ordered, and personally interpreted labs.  The pertinent results include:  cbc nl, lactic elevated at 3.5, istat chem 8 with glucose elevated at 260.  Other labs pending at time of tx to cath lab.   Imaging Studies ordered:  I ordered imaging studies including cxr  I independently visualized and interpreted imaging which showed Low volume film without acute cardiopulmonary findings.  I agree with the radiologist interpretation   Cardiac Monitoring:  The patient was maintained on a cardiac monitor.  I personally viewed and interpreted the cardiac monitored which showed an underlying rhythm of: nsr   Medicines ordered and prescription drug management:  I ordered medication including heparin   for stemi  Reevaluation of the  patient after these medicines showed that the patient improved I have reviewed the patients home medicines and have made adjustments as needed   Critical Interventions:  Code stemi   Consultations Obtained:  I requested consultation with the cardiologist (Dr. Verlin),  and discussed lab and imaging findings as well as pertinent plan -he will take pt to the cath lab   Problem List / ED Course:  STEMI:  inferior wall with likely RCA blockage.  Pt given asa by EMS.  He was given heparin  here.  CXR clear.  Due to cath lab holiday scheduling, there was a slight delay getting him to the cath lab, but he he remains stable for cath lab.   Reevaluation:  After the interventions noted above, I reevaluated the patient and found that they have :improved   Social Determinants of Health:  Lives at home   Dispostion:  After consideration of the diagnostic results and the patients response to treatment, I feel that the patent would benefit from admission.    CRITICAL CARE Performed by: Mliss Boyers   Total critical care time: 30 minutes  Critical care time was exclusive of separately billable procedures and treating other patients.  Critical care was necessary to treat or prevent imminent or life-threatening deterioration.  Critical care was time spent personally by me on the following activities: development of treatment plan with patient and/or surrogate as well as nursing, discussions with consultants, evaluation of patient's response to treatment, examination of patient, obtaining history from patient or surrogate, ordering and performing treatments and interventions, ordering and review of laboratory studies, ordering and review of radiographic studies, pulse oximetry and re-evaluation of patient's condition.      Final diagnoses:  Acute ST elevation myocardial infarction (STEMI), unspecified artery [facility])    ED Discharge Orders     None          Boyers Mliss,  MD 11/16/23 [phone]

## 2023-11-17 ENCOUNTER — Encounter (HOSPITAL_COMMUNITY): Payer: Self-pay | Admitting: Cardiovascular Disease

## 2023-11-17 ENCOUNTER — Inpatient Hospital Stay (HOSPITAL_COMMUNITY)

## 2023-11-17 ENCOUNTER — Other Ambulatory Visit (HOSPITAL_COMMUNITY): Payer: Self-pay

## 2023-11-17 DIAGNOSIS — I5021 Acute systolic (congestive) heart failure: Secondary | ICD-10-CM | POA: Diagnosis not present

## 2023-11-17 DIAGNOSIS — I2119 ST elevation (STEMI) myocardial infarction involving other coronary artery of inferior wall: Secondary | ICD-10-CM | POA: Diagnosis not present

## 2023-11-17 LAB — ECHOCARDIOGRAM LIMITED
AR max vel: 2.97 cm2
AV Peak grad: 6.4 mmHg
Ao pk vel: 1.27 m/s
Area-P 1/2: 3.31 cm2
Est EF: 55
Height: 69 in
Weight: 3791.91 [oz_av]

## 2023-11-17 LAB — CBC
HCT: 42.8 % (ref 39.0–52.0)
Hemoglobin: 13.8 g/dL (ref 13.0–17.0)
MCH: 31 pg (ref 26.0–34.0)
MCHC: 32.2 g/dL (ref 30.0–36.0)
MCV: 96.2 fL (ref 80.0–100.0)
Platelets: 262 K/uL (ref 150–400)
RBC: 4.45 MIL/uL (ref 4.22–5.81)
RDW: 13.3 % (ref 11.5–15.5)
WBC: 14.1 K/uL — ABNORMAL HIGH (ref 4.0–10.5)
nRBC: 0 % (ref 0.0–0.2)

## 2023-11-17 LAB — GLUCOSE, CAPILLARY
Glucose-Capillary: 187 mg/dL — ABNORMAL HIGH (ref 70–99)
Glucose-Capillary: 127 mg/dL — ABNORMAL HIGH (ref 70–99)
Glucose-Capillary: 125 mg/dL — ABNORMAL HIGH (ref 70–99)
Glucose-Capillary: 167 mg/dL — ABNORMAL HIGH (ref 70–99)

## 2023-11-17 LAB — BASIC METABOLIC PANEL WITH GFR
Anion gap: 11 (ref 5–15)
BUN: 10 mg/dL (ref 8–23)
CO2: 25 mmol/L (ref 22–32)
Calcium: 9.2 mg/dL (ref 8.9–10.3)
Chloride: 99 mmol/L (ref 98–111)
Creatinine, Ser: 0.93 mg/dL (ref 0.61–1.24)
GFR, Estimated: >60 mL/min (ref >60)
Glucose, Bld: 182 mg/dL — ABNORMAL HIGH (ref 70–99)
Potassium: 3.9 mmol/L (ref 3.5–5.1)
Sodium: 135 mmol/L (ref 135–145)

## 2023-11-17 LAB — MAGNESIUM: Magnesium: 1.6 mg/dL — ABNORMAL LOW (ref 1.7–2.4)

## 2023-11-17 LAB — LACTIC ACID, PLASMA: Lactic Acid, Venous: 2.8 mmol/L (ref 0.5–1.9)

## 2023-11-17 MED ORDER — TICAGRELOR 90 MG PO TABS
90.0000 mg | ORAL_TABLET | Freq: Two times a day (BID) | ORAL | 11 refills | Status: DC
  Filled 2023-11-17: qty 60, 30d supply, fill #0

## 2023-11-17 MED ORDER — ASPIRIN 81 MG PO TBEC
81.0000 mg | DELAYED_RELEASE_TABLET | Freq: Every day | ORAL | 12 refills | Status: AC
  Filled 2023-11-17: qty 30, 30d supply, fill #0

## 2023-11-17 MED ORDER — PERFLUTREN LIPID MICROSPHERE
1.0000 mL | INTRAVENOUS | Status: AC | PRN
  Administered 2023-11-17: 4 mL via INTRAVENOUS

## 2023-11-17 MED ORDER — DAPAGLIFLOZIN PROPANEDIOL 10 MG PO TABS
10.0000 mg | ORAL_TABLET | Freq: Every day | ORAL | 11 refills | Status: AC
  Filled 2023-11-17: qty 30, 30d supply, fill #0

## 2023-11-17 MED ORDER — NITROGLYCERIN 0.4 MG SL SUBL
0.4000 mg | SUBLINGUAL_TABLET | SUBLINGUAL | 2 refills | Status: AC | PRN
  Filled 2023-11-17: qty 25, 8d supply, fill #0

## 2023-11-17 MED ORDER — MUPIROCIN 2 % EX OINT
TOPICAL_OINTMENT | Freq: Two times a day (BID) | CUTANEOUS | Status: DC
  Filled 2023-11-17 (×2): qty 22

## 2023-11-17 MED ORDER — DAPAGLIFLOZIN PROPANEDIOL 10 MG PO TABS
10.0000 mg | ORAL_TABLET | Freq: Every day | ORAL | Status: DC
  Administered 2023-11-17 – 2023-11-19 (×3): 10 mg via ORAL
  Filled 2023-11-17 (×3): qty 1

## 2023-11-17 MED ORDER — ATORVASTATIN CALCIUM 80 MG PO TABS
80.0000 mg | ORAL_TABLET | Freq: Every day | ORAL | 11 refills | Status: AC
  Filled 2023-11-17: qty 30, 30d supply, fill #0

## 2023-11-17 MED ORDER — MAGNESIUM SULFATE 4 GM/100ML IV SOLN
4.0000 g | Freq: Once | INTRAVENOUS | Status: AC
  Administered 2023-11-17: 4 g via INTRAVENOUS
  Filled 2023-11-17: qty 100

## 2023-11-17 NOTE — Progress Notes (Signed)
 Echocardiogram 2D Echocardiogram has been performed.  Richard Reid 11/17/2023, 12:29 PM

## 2023-11-17 NOTE — Consult Note (Addendum)
 WOC Nurse Consult Note: Reason for Consult: wound behind r ear   Wound type: full thickness  vs lesion of unknown etiology; patient says has been present for over a year  Pressure Injury POA: NA  Measurement: see nursing flowsheet  Wound bed: brown fibrinous and dark hemorrhagic  Drainage (amount, consistency, odor) see nursing flowsheet  Periwound:  2 smaller areas adjacent dark hemorrhagic  Dressing procedure/placement/frequency: Cleanse wound behind R ear with NS apply Mupirocin  ointment 2 times daily and leave open to air or apply silicone foam whichever is preferred.   Since patient says this wound/lesion has been present for at least a year would recommend he follow with dermatology at discharge for definitive diagnosis. Would not want to apply a debriding agent to wound without knowing what its etiology is.    POC discussed with bedside nurse. WOC team will not follow. Re-consult if further needs arise.   Thank you,    Powell Bar MSN, RN-BC, [employer]

## 2023-11-17 NOTE — Evaluation (Signed)
 Physical Therapy Evaluation Patient Details Name: Richard Reid MRN: [id number] DOB: [birth year] Today's Date: 11/17/2023  History of Present Illness  78 year old male admitted 7/4 with chest pain, EKG concerning for STEMI. PMH CAD, HLD, DMII, hypothyroidism, and HTN.  Clinical Impression  Pt admitted with above diagnosis. Reports falling at home PTA due to Lt knee buckling, uses RW for support. Lives with wife and son; wife unable to assist, son works. Patient able to rise to EOB with min assist today, stand and ambulate with CGA and RW for support. Mild instability but adequately supported by assistive device. Will progress acutely during hospitalization. Pt currently with functional limitations due to the deficits listed below (see PT Problem List). Pt will benefit from acute skilled PT to increase their independence and safety with mobility to allow discharge.           If plan is discharge home, recommend the following: A little help with walking and/or transfers;A little help with bathing/dressing/bathroom;Assistance with cooking/housework;Assist for transportation;Help with stairs or ramp for entrance   Can travel by private vehicle        Equipment Recommendations None recommended by PT  Recommendations for Other Services       Functional Status Assessment Patient has had a recent decline in their functional status and demonstrates the ability to make significant improvements in function in a reasonable and predictable amount of time.     Precautions / Restrictions Precautions Precautions: Fall Recall of Precautions/Restrictions: Intact Restrictions Weight Bearing Restrictions Per Provider Order: Yes RUE Weight Bearing Per Provider Order: Non weight bearing RLE Weight Bearing Per Provider Order: Weight bearing as tolerated      Mobility  Bed Mobility Overal bed mobility: Needs Assistance Bed Mobility: Supine to Sit     Supine to sit: Min assist     General bed  mobility comments: Min assist to pull through therapist's hand and rise to EOB. Cues for technique.    Transfers Overall transfer level: Needs assistance Equipment used: Rolling walker (2 wheels) Transfers: Sit to/from Stand Sit to Stand: Contact guard assist           General transfer comment: CGA for safety, slow to rise, cues for hand placement, stable once upright.    Ambulation/Gait Ambulation/Gait assistance: Contact guard assist Gait Distance (Feet): 90 Feet Assistive device: Rolling walker (2 wheels) Gait Pattern/deviations: Step-through pattern, Decreased stride length Gait velocity: dec Gait velocity interpretation: <1.8 ft/sec, indicate of risk for recurrent falls   General Gait Details: Minor instability but able to self correct with adequate support from RW. Cues for technique and awareness. Pt reports cramp in LEs while walking, worried about buckling but no occurrance with distance covered today. VSS.  Stairs            Wheelchair Mobility     Tilt Bed    Modified Rankin (Stroke Patients Only)       Balance Overall balance assessment: Needs assistance Sitting-balance support: No upper extremity supported, Feet supported Sitting balance-Leahy Scale: Good     Standing balance support: No upper extremity supported Standing balance-Leahy Scale: Fair                               Pertinent Vitals/Pain Pain Assessment Pain Assessment: No/denies pain    Home Living Family/patient expects to be discharged to:: Private residence Living Arrangements: Spouse/significant other;Children Available Help at Discharge: Family;Available 24 hours/day (wife limited, son works)  Type of Home: House Home Access: Stairs to enter Entrance Stairs-Rails: Doctor, general practice of Steps: 4   Home Layout: One level Home Equipment: Agricultural consultant (2 wheels);Cane - single point;BSC/3in1;Grab bars - tub/shower      Prior Function Prior  Level of Function : Independent/Modified Independent;History of Falls (last six months)             Mobility Comments: RW for gait, reports multiple falls, due to LLE buckling ADLs Comments: mod I     Extremity/Trunk Assessment   Upper Extremity Assessment Upper Extremity Assessment: Defer to OT evaluation    Lower Extremity Assessment Lower Extremity Assessment: Generalized weakness       Communication   Communication Communication: Impaired Factors Affecting Communication: Hearing impaired    Cognition Arousal: Alert Behavior During Therapy: WFL for tasks assessed/performed   PT - Cognitive impairments: No family/caregiver present to determine baseline                         Following commands: Intact       Cueing Cueing Techniques: Verbal cues, Gestural cues     General Comments General comments (skin integrity, edema, etc.): VSS    Exercises     Assessment/Plan    PT Assessment Patient needs continued PT services  PT Problem List Decreased strength;Decreased activity tolerance;Decreased balance;Decreased mobility;Decreased knowledge of use of DME;Decreased safety awareness;Decreased knowledge of precautions;Cardiopulmonary status limiting activity;Obesity       PT Treatment Interventions DME instruction;Gait training;Stair training;Functional mobility training;Therapeutic activities;Therapeutic exercise;Balance training;Neuromuscular re-education;Patient/family education    PT Goals (Current goals can be found in the Care Plan section)  Acute Rehab PT Goals Patient Stated Goal: Go home PT Goal Formulation: With patient Time For Goal Achievement: 12/01/23 Potential to Achieve Goals: Good    Frequency Min 2X/week     Co-evaluation               AM-PAC PT 6 Clicks Mobility  Outcome Measure Help needed turning from your back to your side while in a flat bed without using bedrails?: A Little Help needed moving from lying on your  back to sitting on the side of a flat bed without using bedrails?: A Little Help needed moving to and from a bed to a chair (including a wheelchair)?: A Little Help needed standing up from a chair using your arms (e.g., wheelchair or bedside chair)?: A Little Help needed to walk in hospital room?: A Little Help needed climbing 3-5 steps with a railing? : A Little 6 Click Score: 18    End of Session Equipment Utilized During Treatment: Gait belt Activity Tolerance: Patient tolerated treatment well Patient left: in chair;with call bell/phone within reach (Refused SCDs) Nurse Communication: Mobility status PT Visit Diagnosis: Unsteadiness on feet (R26.81);Other abnormalities of gait and mobility (R26.89);Muscle weakness (generalized) (M62.81);Repeated falls (R29.6);History of falling (Z91.81)    Time: 8495-8473 PT Time Calculation (min) (ACUTE ONLY): 22 min   Charges:   PT Evaluation $PT Eval Low Complexity: 1 Low   PT General Charges $$ ACUTE PT VISIT: 1 Visit         Leontine Roads, PT, DPT [facility]  Rehabilitation Services Physical Therapist Office: [phone] Website: [email or web address]   Leontine GORMAN Roads 11/17/2023, 4:41 PM

## 2023-11-17 NOTE — Progress Notes (Addendum)
 Patient ID: Richard Reid, male   DOB: [birth year], 78 y.o.   MRN: [id number]     Advanced Heart Failure Rounding Note  Cardiologist: None  Chief Complaint: Chest pain Subjective:    Norepinephrine  just turned off this morning.  BP stable after stopping.   Echo yesterday was a poor study, grossly normal LV EF and RV not well-visualized.   No chest pain or dyspnea this morning.    Objective:   Weight Range: 107.5 kg Body mass index is 35 kg/m.   Vital Signs:   Temp:  [97.7 F (36.5 C)-98.7 F (37.1 C)] 98.5 F (36.9 C) (07/05 0347) Pulse Rate:  [49-88] 73 (07/05 0745) Resp:  [13-29] 24 (07/05 0745) BP: (81-120)/(31-78) 104/56 (07/05 0745) SpO2:  [88 %-97 %] 91 % (07/05 0745) Weight:  [107.5 kg] 107.5 kg (07/05 0500) Last BM Date : 11/16/23  Weight change: Filed Weights   11/16/23 0800 11/16/23 0809 11/17/23 0500  Weight: 109.8 kg 109.8 kg 107.5 kg    Intake/Output:   Intake/Output Summary (Last 24 hours) at 11/17/2023 0811 Last data filed at 11/17/2023 0800 Gross per 24 hour  Intake 1381.44 ml  Output 2885 ml  Net -1503.56 ml      Physical Exam    General:  Well appearing. No resp difficulty HEENT: Normal Neck: Supple. JVP not elevated. Carotids 2+ bilat; no bruits. No lymphadenopathy or thyromegaly appreciated. Cor: PMI nondisplaced. Regular rate & rhythm. No rubs, gallops or murmurs. Lungs: Clear Abdomen: Soft, nontender, nondistended. No hepatosplenomegaly. No bruits or masses. Good bowel sounds. Extremities: No cyanosis, clubbing, rash, edema Neuro: Alert & orientedx3, cranial nerves grossly intact. moves all 4 extremities w/o difficulty. Affect pleasant   Telemetry   NSR 80s (personally reviewed)   Labs    CBC Recent Labs    11/16/23 0812 11/16/23 0821 11/16/23 1045 11/17/23 0405  WBC 11.7*  --   --  14.1*  NEUTROABS 8.1*  --   --   --   HGB 14.4   < > 13.9 13.8  HCT 44.4   < > 41.0 42.8  MCV 98.4  --   --  96.2  PLT 247  --   --  262    < > = values in this interval not displayed.   Basic Metabolic Panel Recent Labs    92/95/74 0812 11/16/23 0821 11/16/23 1043 11/16/23 1045 11/17/23 0405  NA 135 135   < > 138 135  K 3.4* 3.7   < > 3.6 3.9  CL 97* 98  --   --  99  CO2 25  --   --   --  25  GLUCOSE 255* 260*  --   --  182*  BUN 19 24*  --   --  10  CREATININE 0.97 1.10  --   --  0.93  CALCIUM  9.4  --   --   --  9.2  MG  --   --   --   --  1.6*   < > = values in this interval not displayed.   Liver Function Tests Recent Labs    11/16/23 0812  AST 27  ALT 13  ALKPHOS 53  BILITOT 0.6  PROT 7.0  ALBUMIN 3.2*   No results for input(s): LIPASE, AMYLASE in the last 72 hours. Cardiac Enzymes No results for input(s): CKTOTAL, CKMB, CKMBINDEX, TROPONINI in the last 72 hours.  BNP: BNP (last 3 results) No results for input(s): BNP in the last 8760  hours.  ProBNP (last 3 results) No results for input(s): PROBNP in the last 8760 hours.   D-Dimer No results for input(s): DDIMER in the last 72 hours. Hemoglobin A1C Recent Labs    11/16/23 0812  HGBA1C 7.0*   Fasting Lipid Panel Recent Labs    11/16/23 0812  CHOL 139  HDL 30*  LDLCALC 59  TRIG 751*  CHOLHDL 4.6   Thyroid Function Tests Recent Labs    11/16/23 1248  TSH 3.826    Other results:   Imaging    ECHOCARDIOGRAM COMPLETE Result Date: 11/16/2023    ECHOCARDIOGRAM REPORT   Patient Name:   Richard Reid Date of Exam: 11/16/2023 Medical Rec #:  [id number]       Height:       69.0 in Accession #:    [id number]      Weight:       242.0 lb Date of Birth:  [birth year]      BSA:          2.240 m Patient Age:    77 years        BP:           111/60 mmHg Patient Gender: M               HR:           69 bpm. Exam Location:  Inpatient Procedure: 2D Echo, Cardiac Doppler, Color Doppler and Intracardiac            Opacification Agent (Both Spectral and Color Flow Doppler were            utilized during procedure). Indications:     CAD Native Vessel I25.10  History:        Patient has no prior history of Echocardiogram examinations.                 CAD; Risk Factors:Hypertension.  Sonographer:    Jayson Gaskins Referring Phys: 81 CHRISTOPHER D MCALHANY IMPRESSIONS  1. Grossly poor echo images despite Definity . No obvious LV thrombus.  2. Left ventricular ejection fraction is grossly normal. Left ventricular endocardial border is not optimally defined to evaluate regional wall motion. Left ventricular diastolic function could not be evaluated.  3. Right ventricular systolic function was not well visualized. The right ventricular size is not well visualized.  4. The mitral valve was not well visualized. No evidence of mitral valve regurgitation. No evidence of mitral stenosis.  5. The aortic valve was not well visualized. Aortic valve regurgitation is not visualized. No aortic stenosis is present.  6. Limited interrogation of valves. Consider repeating limited echocardiogram or alternative modality for evaluation of valvular dysfunction. FINDINGS  Left Ventricle: Left ventricular ejection fraction, by estimation, is 60 to 65%. The left ventricle has normal function. Left ventricular endocardial border not optimally defined to evaluate regional wall motion. Strain was performed and the global longitudinal strain is indeterminate. The left ventricular internal cavity size was normal in size. There is no left ventricular hypertrophy. Left ventricular diastolic function could not be evaluated due to nondiagnostic images. Left ventricular diastolic function could not be evaluated. Right Ventricle: The right ventricular size is not well visualized. Right vetricular wall thickness was not well visualized. Right ventricular systolic function was not well visualized. Left Atrium: Left atrial size was not well visualized. Right Atrium: Right atrial size was not well visualized. Pericardium: There is no evidence of pericardial effusion. Mitral Valve: The  mitral valve was not well visualized.  No evidence of mitral valve regurgitation. No evidence of mitral valve stenosis. Tricuspid Valve: The tricuspid valve is not well visualized. Tricuspid valve regurgitation is not demonstrated. No evidence of tricuspid stenosis. Aortic Valve: The aortic valve was not well visualized. Aortic valve regurgitation is not visualized. No aortic stenosis is present. Aortic valve mean gradient measures 7.0 mmHg. Aortic valve peak gradient measures 11.7 mmHg. Aortic valve area, by VTI measures 2.03 cm. Pulmonic Valve: The pulmonic valve was not well visualized. Pulmonic valve regurgitation is not visualized. No evidence of pulmonic stenosis. Aorta: The aortic root was not well visualized and the ascending aorta was not well visualized. Venous: The inferior vena cava was not well visualized. IAS/Shunts: The interatrial septum was not well visualized. Additional Comments: 3D was performed not requiring image post processing on an independent workstation and was indeterminate.  LEFT VENTRICLE PLAX 2D LVOT diam:     2.00 cm   Diastology LV SV:         83        LV e' medial:    4.79 cm/s LV SV Index:   37        LV E/e' medial:  14.0 LVOT Area:     3.14 cm  LV e' lateral:   8.92 cm/s                          LV E/e' lateral: 7.5  LEFT ATRIUM           Index LA Vol (A4C): 42.5 ml 18.97 ml/m  AORTIC VALVE AV Area (Vmax):    2.22 cm AV Area (Vmean):   2.22 cm AV Area (VTI):     2.03 cm AV Vmax:           171.00 cm/s AV Vmean:          126.000 cm/s AV VTI:            0.411 m AV Peak Grad:      11.7 mmHg AV Mean Grad:      7.0 mmHg LVOT Vmax:         121.00 cm/s LVOT Vmean:        88.900 cm/s LVOT VTI:          0.265 m LVOT/AV VTI ratio: 0.64 MITRAL VALVE MV Area (PHT): 3.08 cm    SHUNTS MV Decel Time: 246 msec    Systemic VTI:  0.26 m MV E velocity: 67.10 cm/s  Systemic Diam: 2.00 cm MV A velocity: 99.60 cm/s MV E/A ratio:  0.67 Vishnu Priya Mallipeddi Electronically signed by Diannah Late  Mallipeddi Signature Date/Time: 11/16/2023/4:37:57 PM    Final    CARDIAC CATHETERIZATION Addendum Date: 11/16/2023   Ost RCA to Prox RCA lesion is 99% stenosed.   Dist LAD-2 lesion is 70% stenosed.   Dist LAD-1 lesion is 70% stenosed.   A drug-eluting stent was successfully placed using a STENT ONYX FRONTIER 4.5X15.   Post intervention, there is a 0% residual stenosis. Inferior STEMI secondary to thrombotic occlusion of the ostial RCA Successful PTCA/DES x 1 ostial RCA Distal LAD stent with diffuse restenosis leading into the apical LAD this is diffusely diseased and not a favorable target for stenting. No obstructive disease in the Circumflex LVEDP=18 mmHg Recommendations: Will admit to the ICU. Aggrastat  infusion for 1 hours. DAPT with ASA and Brilinta , high intensity statin. No beta blocker given hypotension. I did review his case with the Shock team. Right  heart numbers are ok. He is requiring low dose Levophed  at the end of the case. No support device will be placed. Echo later today.   Result Date: 11/16/2023   Ost RCA to Prox RCA lesion is 99% stenosed.   Dist LAD-2 lesion is 70% stenosed.   Dist LAD-1 lesion is 70% stenosed.   A drug-eluting stent was successfully placed using a STENT ONYX FRONTIER 4.5X15.   Post intervention, there is a 0% residual stenosis. Inferior STEMI secondary to thrombotic occlusion of the ostial RCA Successful PTCA/DES x 1 ostial RCA Distal LAD stent with diffuse restenosis leading into the apical LAD this is diffusely diseased and not a favorable target for stenting. No obstructive disease in the Circumflex LVEDP=18 mmHg Recommendations: Will admit to the ICU. Aggrastat  infusion for 1 hours. DAPT with ASA and Brilinta , high intensity statin. No beta blocker given hypotension. I did review his case with the Shock team. Right heart numbers are ok. He is requiring low dose Levophed  at the end of the case. No support device will be placed. Echo later today.   DG Chest Port 1  View Result Date: 11/16/2023 CLINICAL DATA:  Chest pain. EXAM: PORTABLE CHEST 1 VIEW COMPARISON:  06/19/2023 FINDINGS: Low volume film. The lungs are clear without focal pneumonia, edema, pneumothorax or pleural effusion. The cardio pericardial silhouette is enlarged. No acute bony abnormality. Telemetry leads overlie the chest. IMPRESSION: Low volume film without acute cardiopulmonary findings. Electronically Signed   By: Camellia Candle M.D.   On: 11/16/2023 08:22     Medications:     Scheduled Medications:  aspirin  EC  81 mg Oral Daily   atorvastatin   80 mg Oral Daily   Chlorhexidine  Gluconate Cloth  6 each Topical Daily   enoxaparin  (LOVENOX ) injection  40 mg Subcutaneous Q24H   insulin  aspart  0-20 Units Subcutaneous TID WC   levothyroxine   25 mcg Oral Daily   sodium chloride  flush  3 mL Intravenous Q12H   ticagrelor   90 mg Oral BID    Infusions:  sodium chloride  Stopped (11/17/23 0732)   sodium chloride      sodium chloride      magnesium  sulfate bolus IVPB 50 mL/hr at 11/17/23 0800   norepinephrine  (LEVOPHED ) Adult infusion 1 mcg/min (11/17/23 0800)    PRN Medications: sodium chloride , acetaminophen , ondansetron  (ZOFRAN ) IV, mouth rinse, sodium chloride  flush    Assessment/Plan   1. CAD: Patient with history of PCI to distal LAD.  Admitted with inferior STEMI, found to have 99% ostial RCA stenosis and 70% diffuse distal LAD stenosis. Now s/p DES to proximal RCA with resolution of STE and chest pain.  - ASA + Brilinta  - Atorvastatin  80 daily.  2. Cardiogenic shock: Initially lactate was elevated at 3.5 with hypotension requiring NE up to 20.  He recovered rapidly post-PCI to RCA. RHC showed normal filling pressures and preserved cardiac output.  Echo was a difficult study, grossly normal LV EF, RV not visualized.  NE was slowly titrated off, just stopped this morning.  - Watch off NE, if BP stays stable can go to telemetry later today.  - Check lactate 1 more time to make  sure it is cleared.  - As below will start Farxiga .   - Needs better assessment of LV/RV function, would get cardiac MRI Monday if he is still here or as an outpatient.  3. Type 2 DM: He has been on glipizide at home, will transition to Farxiga  10 mg daily.   Mobilize.  Length of Stay: 1  Ezra Shuck, MD  11/17/2023, 8:11 AM  Advanced Heart Failure Team Pager [phone] (M-F; 7a - 5p)  Please contact [facility] for night-coverage after hours (5p -7a ) and weekends on [email or web address]

## 2023-11-17 NOTE — Progress Notes (Signed)
 PT Cancellation Note  Patient Details Name: Richard Reid MRN: [id number] DOB: January 04, 1946   Cancelled Treatment:    Reason Eval/Treat Not Completed: Patient at procedure or test/unavailable  Undergoing testing in room currently. Check back this afternoon for PT evaluation.  Richard Reid, PT, DPT [facility]  Rehabilitation Services Physical Therapist Office: [phone] Website: [email or web address]   Richard Reid 11/17/2023, 12:00 PM

## 2023-11-17 NOTE — Discharge Instructions (Signed)
 Radial Site Care Refer to this sheet in the next few weeks. These instructions provide you with information on caring for yourself after your procedure. Your caregiver may also give you more specific instructions. Your treatment has been planned according to current medical practices, but problems sometimes occur. Call your caregiver if you have any problems or questions after your procedure. HOME CARE INSTRUCTIONS You may shower the day after the procedure. Remove the bandage (dressing) and gently wash the site with plain soap and water. Gently pat the site dry.  Do not apply powder or lotion to the site.  Do not submerge the affected site in water for 3 to 5 days.  Inspect the site at least twice daily.  Do not flex or bend the affected arm for 24 hours.  No lifting over 5 pounds (2.3 kg) for 5 days after your procedure.  Do not drive home if you are discharged the same day of the procedure. Have someone else drive you.  You may drive 24 hours after the procedure unless otherwise instructed by your caregiver.  What to expect: Any bruising will usually fade within 1 to 2 weeks.  Blood that collects in the tissue (hematoma) may be painful to the touch. It should usually decrease in size and tenderness within 1 to 2 weeks.  SEEK IMMEDIATE MEDICAL CARE IF: You have unusual pain at the radial site.  You have redness, warmth, swelling, or pain at the radial site.  You have drainage (other than a small amount of blood on the dressing).  You have chills.  You have a fever or persistent symptoms for more than 72 hours.  You have a fever and your symptoms suddenly get worse.  Your arm becomes pale, cool, tingly, or numb.  You have heavy bleeding from the site. Hold pressure on the site.    Information about your medication: Brilinta (anti-platelet agent)  Generic Name (Brand): ticagrelor (Brilinta), twice daily medication  PURPOSE: You are taking this medication along with aspirin to lower your  chance of having a heart attack, stroke, or blood clots in your heart stent. These can be fatal. Brilinta and aspirin help prevent platelets from sticking together and forming a clot that can block an artery or your stent.   Common SIDE EFFECTS you may experience include: bruising or bleeding more easily, shortness of breath  Do not stop taking BRILINTA without talking to the doctor who prescribes it for you. People who are treated with a stent and stop taking Brilinta too soon, have a higher risk of getting a blood clot in the stent, having a heart attack, or dying. If you stop Brilinta because of bleeding, or for other reasons, your risk of a heart attack or stroke may increase.   Avoid taking NSAID agents or anti-inflammatory medications such as ibuprofen, naproxen given increased bleed risk with Brilinta - can use acetaminophen  (Tylenol ) if needed for pain.  Tell all of your doctors and dentists that you are taking Brilinta. They should talk to the doctor who prescribed Brilinta for you before you have any surgery or invasive procedure.   Contact your health care provider if you experience: severe or uncontrollable bleeding, pink/red/brown urine, vomiting blood or vomit that looks like coffee grounds, red or black stools (looks like tar), coughing up blood or blood clots ----------------------------------------------------------------------------------------------------------------------

## 2023-11-18 DIAGNOSIS — I2119 ST elevation (STEMI) myocardial infarction involving other coronary artery of inferior wall: Secondary | ICD-10-CM | POA: Diagnosis not present

## 2023-11-18 LAB — BASIC METABOLIC PANEL WITH GFR
Anion gap: 14 (ref 5–15)
BUN: 10 mg/dL (ref 8–23)
CO2: 24 mmol/L (ref 22–32)
Calcium: 8.8 mg/dL — ABNORMAL LOW (ref 8.9–10.3)
Chloride: 96 mmol/L — ABNORMAL LOW (ref 98–111)
Creatinine, Ser: 0.98 mg/dL (ref 0.61–1.24)
GFR, Estimated: >60 mL/min (ref >60)
Glucose, Bld: 118 mg/dL — ABNORMAL HIGH (ref 70–99)
Potassium: 3.7 mmol/L (ref 3.5–5.1)
Sodium: 134 mmol/L — ABNORMAL LOW (ref 135–145)

## 2023-11-18 LAB — GLUCOSE, CAPILLARY
Glucose-Capillary: 137 mg/dL — ABNORMAL HIGH (ref 70–99)
Glucose-Capillary: 175 mg/dL — ABNORMAL HIGH (ref 70–99)
Glucose-Capillary: 149 mg/dL — ABNORMAL HIGH (ref 70–99)
Glucose-Capillary: 156 mg/dL — ABNORMAL HIGH (ref 70–99)

## 2023-11-18 LAB — CBC
HCT: 42.2 % (ref 39.0–52.0)
Hemoglobin: 13.7 g/dL (ref 13.0–17.0)
MCH: 32 pg (ref 26.0–34.0)
MCHC: 32.5 g/dL (ref 30.0–36.0)
MCV: 98.6 fL (ref 80.0–100.0)
Platelets: 178 K/uL (ref 150–400)
RBC: 4.28 MIL/uL (ref 4.22–5.81)
RDW: 13.5 % (ref 11.5–15.5)
WBC: 9.7 K/uL (ref 4.0–10.5)
nRBC: 0 % (ref 0.0–0.2)

## 2023-11-18 LAB — LACTIC ACID, PLASMA: Lactic Acid, Venous: 1.8 mmol/L (ref 0.5–1.9)

## 2023-11-18 LAB — MAGNESIUM: Magnesium: 2 mg/dL (ref 1.7–2.4)

## 2023-11-18 MED ORDER — SPIRONOLACTONE 12.5 MG HALF TABLET: 12.5000 mg | ORAL_TABLET | Freq: Every day | ORAL | Status: DC

## 2023-11-18 MED ORDER — POTASSIUM CHLORIDE CRYS ER 20 MEQ PO TBCR
40.0000 meq | EXTENDED_RELEASE_TABLET | Freq: Once | ORAL | Status: AC
  Administered 2023-11-18: 40 meq via ORAL
  Filled 2023-11-18: qty 2

## 2023-11-18 NOTE — Plan of Care (Signed)
  Problem: Education: Goal: Knowledge of General Education information will improve Description: Including pain rating scale, medication(s)/side effects and non-pharmacologic comfort measures Outcome: Progressing   Problem: Health Behavior/Discharge Planning: Goal: Ability to manage health-related needs will improve Outcome: Progressing   Problem: Clinical Measurements: Goal: Ability to maintain clinical measurements within normal limits will improve Outcome: Progressing Goal: Will remain free from infection Outcome: Progressing Goal: Diagnostic test results will improve Outcome: Progressing Goal: Respiratory complications will improve Outcome: Progressing   Problem: Activity: Goal: Risk for activity intolerance will decrease Outcome: Progressing   Problem: Nutrition: Goal: Adequate nutrition will be maintained Outcome: Progressing   Problem: Coping: Goal: Level of anxiety will decrease Outcome: Progressing   Problem: Elimination: Goal: Will not experience complications related to bowel motility Outcome: Progressing Goal: Will not experience complications related to urinary retention Outcome: Progressing   Problem: Safety: Goal: Ability to remain free from injury will improve Outcome: Progressing   Problem: Skin Integrity: Goal: Risk for impaired skin integrity will decrease Outcome: Progressing   Problem: Education: Goal: Understanding of CV disease, CV risk reduction, and recovery process will improve Outcome: Progressing Goal: Individualized Educational Video(s) Outcome: Progressing

## 2023-11-18 NOTE — Progress Notes (Addendum)
 Patient ID: Richard Reid, male   DOB: 1945-09-08, 78 y.o.   MRN: [id number]     Advanced Heart Failure Rounding Note  Cardiologist: None  Chief Complaint: Chest pain Subjective:    BP now stable, SBP 110s.  Denies dyspnea or chest pain.   Repeat echo yesterday was a little clearer study, EF 55% with low normal RV function.   Lactate yesterday morning was still elevated 2.8.   Walked with PT yesterday, weak and recommended home health/PT.    Objective:   Weight Range: 107.5 kg Body mass index is 35 kg/m.   Vital Signs:   Temp:  [98.1 F (36.7 C)-98.3 F (36.8 C)] 98.1 F (36.7 C) (07/06 0616) Pulse Rate:  [68-90] 78 (07/06 0800) Resp:  [14-30] 25 (07/06 0800) BP: (67-127)/(47-104) 115/73 (07/06 0800) SpO2:  [91 %-98 %] 94 % (07/06 0800) Last BM Date : 11/16/23  Weight change: Filed Weights   11/16/23 0800 11/16/23 0809 11/17/23 0500  Weight: 109.8 kg 109.8 kg 107.5 kg    Intake/Output:   Intake/Output Summary (Last 24 hours) at 11/18/2023 0848 Last data filed at 11/18/2023 0800 Gross per 24 hour  Intake 718.94 ml  Output 2375 ml  Net -1656.06 ml      Physical Exam    General: NAD Neck: No JVD, no thyromegaly or thyroid nodule.  Lungs: Clear to auscultation bilaterally with normal respiratory effort. CV: Nondisplaced PMI.  Heart regular S1/S2, no S3/S4, no murmur.  No peripheral edema.    Abdomen: Soft, nontender, no hepatosplenomegaly, no distention.  Skin: Intact without lesions or rashes.  Neurologic: Alert and oriented x 3.  Psych: Normal affect. Extremities: No clubbing or cyanosis.  HEENT: Normal.   Telemetry   NSR 80s (personally reviewed)   Labs    CBC Recent Labs    11/16/23 0812 11/16/23 0821 11/17/23 0405 11/18/23 0250  WBC 11.7*  --  14.1* 9.7  NEUTROABS 8.1*  --   --   --   HGB 14.4   < > 13.8 13.7  HCT 44.4   < > 42.8 42.2  MCV 98.4  --  96.2 98.6  PLT 247  --  262 178   < > = values in this interval not displayed.    Basic Metabolic Panel Recent Labs    92/94/74 0405 11/18/23 0250  NA 135 134*  K 3.9 3.7  CL 99 96*  CO2 25 24  GLUCOSE 182* 118*  BUN 10 10  CREATININE 0.93 0.98  CALCIUM  9.2 8.8*  MG 1.6*  --    Liver Function Tests Recent Labs    11/16/23 0812  AST 27  ALT 13  ALKPHOS 53  BILITOT 0.6  PROT 7.0  ALBUMIN 3.2*   No results for input(s): LIPASE, AMYLASE in the last 72 hours. Cardiac Enzymes No results for input(s): CKTOTAL, CKMB, CKMBINDEX, TROPONINI in the last 72 hours.  BNP: BNP (last 3 results) No results for input(s): BNP in the last 8760 hours.  ProBNP (last 3 results) No results for input(s): PROBNP in the last 8760 hours.   D-Dimer No results for input(s): DDIMER in the last 72 hours. Hemoglobin A1C Recent Labs    11/16/23 0812  HGBA1C 7.0*   Fasting Lipid Panel Recent Labs    11/16/23 0812  CHOL 139  HDL 30*  LDLCALC 59  TRIG 751*  CHOLHDL 4.6   Thyroid Function Tests Recent Labs    11/16/23 1248  TSH 3.826    Other results:  Imaging    ECHOCARDIOGRAM LIMITED Result Date: 11/17/2023    ECHOCARDIOGRAM LIMITED REPORT   Patient Name:   Richard Reid Date of Exam: 11/17/2023 Medical Rec #:  [id number]       Height:       69.0 in Accession #:    [id number]      Weight:       237.0 lb Date of Birth:  [birth year]      BSA:          2.221 m Patient Age:    77 years        BP:           95/56 mmHg Patient Gender: M               HR:           73 bpm. Exam Location:  Inpatient Procedure: Limited Echo, Limited Color Doppler and Intracardiac Opacification            Agent (Both Spectral and Color Flow Doppler were utilized during            procedure). Indications:    CHF- Acute Systolic I50.21  History:        Patient has prior history of Echocardiogram examinations, most                 recent 11/16/2023. Acute MI, Signs/Symptoms:Shortness of Breath;                 Risk Factors:Hypertension and Dyslipidemia.  Sonographer:     Thea Norlander RCS Referring Phys: EZRA GORMAN SHUCK  Sonographer Comments: Technically difficult study due to poor echo windows, suboptimal apical window and suboptimal parasternal window. IMPRESSIONS  1. Limited images despite Definity  contrast.  2. Left ventricular ejection fraction, by estimation, is approximately 55%. The left ventricle has normal function. Left ventricular endocardial border not optimally defined to evaluate regional wall motion. Left ventricular diastolic parameters are indeterminate.  3. Right ventricular systolic function is low normal based on limited views. Tricuspid regurgitation signal is inadequate for assessing PA pressure.  4. The inferior vena cava is normal in size with <50% respiratory variability, suggesting right atrial pressure of 8 mmHg.  5. The mitral valve was not well visualized.  6. The aortic valve was not well visualized. Aortic valve regurgitation is not visualized. FINDINGS  Left Ventricle: Left ventricular ejection fraction, by estimation, is 55%. The left ventricle has normal function. Left ventricular endocardial border not optimally defined to evaluate regional wall motion. Definity  contrast agent was given IV to delineate the left ventricular endocardial borders. Left ventricular diastolic parameters are indeterminate. Right Ventricle: Right ventricular systolic function is low normal. Tricuspid regurgitation signal is inadequate for assessing PA pressure. Mitral Valve: The mitral valve was not well visualized. Aortic Valve: The aortic valve was not well visualized. Aortic valve regurgitation is not visualized. Aortic valve peak gradient measures 6.4 mmHg. Aorta: The aortic root is normal in size and structure. Venous: The inferior vena cava is normal in size with less than 50% respiratory variability, suggesting right atrial pressure of 8 mmHg. IAS/Shunts: No atrial level shunt detected by color flow Doppler. LEFT VENTRICLE PLAX 2D LVOT diam:     2.30 cm    Diastology LV SV:         72        LV e' medial:    6.42 cm/s LV SV Index:   33        LV E/e'  medial:  12.9 LVOT Area:     4.15 cm  LV e' lateral:   7.07 cm/s                          LV E/e' lateral: 11.7  RIGHT VENTRICLE            IVC RV S prime:     9.14 cm/s  IVC diam: 1.90 cm TAPSE (M-mode): 1.5 cm AORTIC VALVE AV Area (Vmax): 2.97 cm AV Vmax:        126.80 cm/s AV Peak Grad:   6.4 mmHg LVOT Vmax:      90.50 cm/s LVOT Vmean:     58.300 cm/s LVOT VTI:       0.174 m  AORTA Ao Root diam: 3.80 cm MITRAL VALVE MV Area (PHT): 3.31 cm    SHUNTS MV Decel Time: 229 msec    Systemic VTI:  0.17 m MV E velocity: 82.80 cm/s  Systemic Diam: 2.30 cm MV A velocity: 89.60 cm/s MV E/A ratio:  0.92 Jayson Sierras MD Electronically signed by Jayson Sierras MD Signature Date/Time: 11/17/2023/4:45:25 PM    Final      Medications:     Scheduled Medications:  aspirin  EC  81 mg Oral Daily   atorvastatin   80 mg Oral Daily   Chlorhexidine  Gluconate Cloth  6 each Topical Daily   dapagliflozin  propanediol  10 mg Oral Daily   enoxaparin  (LOVENOX ) injection  40 mg Subcutaneous Q24H   insulin  aspart  0-20 Units Subcutaneous TID WC   levothyroxine   25 mcg Oral Daily   mupirocin  ointment   Topical BID   potassium chloride   40 mEq Oral Once   sodium chloride  flush  3 mL Intravenous Q12H   ticagrelor   90 mg Oral BID    Infusions:  norepinephrine  (LEVOPHED ) Adult infusion Stopped (11/17/23 0805)    PRN Medications: acetaminophen , ondansetron  (ZOFRAN ) IV, mouth rinse, sodium chloride  flush    Assessment/Plan   1. CAD: Patient with history of PCI to distal LAD.  Admitted with inferior STEMI, found to have 99% ostial RCA stenosis and 70% diffuse distal LAD stenosis. Now s/p DES to proximal RCA with resolution of STE and chest pain. No plan for distal LAD intervention due to diffuse disease.  - ASA + Brilinta  - Atorvastatin  80 daily.  2. Cardiogenic shock: Initially lactate was elevated at 3.5 with hypotension  requiring NE up to 20.  He recovered rapidly post-PCI to RCA. RHC showed normal filling pressures and preserved cardiac output.  Echo was a difficult study, grossly normal LV EF, RV not visualized.  NE was slowly titrated off though lactate still 2.8 yesterday am.  Repeat echo yesterday was clearer, EF 55% with low normal RV function. No dyspnea, does not look volume overloaded on exam.  BP ist stable today.   - Check lactate 1 more time to make sure it is cleared.  - Continue Farxiga  10 mg daily.   3. Type 2 DM: He has been on glipizide at home, have transitioned Farxiga  10 mg daily.   Mobilize. He will need home health/PT.  He can go to telemetry today.  Hopefully home tomorrow if he remains stable. Can go back to Team C tomorrow.   Length of Stay: 2  Ezra Shuck, MD  11/18/2023, 8:48 AM  Advanced Heart Failure Team Pager [phone] (M-F; 7a - 5p)  Please contact [facility] for night-coverage after hours (5p -7a ) and weekends on [email or web address]

## 2023-11-19 ENCOUNTER — Telehealth: Payer: Self-pay | Admitting: Cardiology

## 2023-11-19 ENCOUNTER — Telehealth (HOSPITAL_COMMUNITY): Payer: Self-pay

## 2023-11-19 ENCOUNTER — Other Ambulatory Visit (HOSPITAL_COMMUNITY): Payer: Self-pay

## 2023-11-19 ENCOUNTER — Telehealth (HOSPITAL_COMMUNITY): Payer: Self-pay | Admitting: Pharmacy Technician

## 2023-11-19 DIAGNOSIS — I2119 ST elevation (STEMI) myocardial infarction involving other coronary artery of inferior wall: Secondary | ICD-10-CM | POA: Diagnosis not present

## 2023-11-19 LAB — CBC
HCT: 41.6 % (ref 39.0–52.0)
Hemoglobin: 13.4 g/dL (ref 13.0–17.0)
MCH: 31.5 pg (ref 26.0–34.0)
MCHC: 32.2 g/dL (ref 30.0–36.0)
MCV: 97.9 fL (ref 80.0–100.0)
Platelets: 203 K/uL (ref 150–400)
RBC: 4.25 MIL/uL (ref 4.22–5.81)
RDW: 13.3 % (ref 11.5–15.5)
WBC: 9.5 K/uL (ref 4.0–10.5)
nRBC: 0 % (ref 0.0–0.2)

## 2023-11-19 LAB — BASIC METABOLIC PANEL WITH GFR
Anion gap: 11 (ref 5–15)
BUN: 13 mg/dL (ref 8–23)
CO2: 22 mmol/L (ref 22–32)
Calcium: 8.7 mg/dL — ABNORMAL LOW (ref 8.9–10.3)
Chloride: 101 mmol/L (ref 98–111)
Creatinine, Ser: 1.04 mg/dL (ref 0.61–1.24)
GFR, Estimated: >60 mL/min (ref >60)
Glucose, Bld: 129 mg/dL — ABNORMAL HIGH (ref 70–99)
Potassium: 4.2 mmol/L (ref 3.5–5.1)
Sodium: 134 mmol/L — ABNORMAL LOW (ref 135–145)

## 2023-11-19 LAB — GLUCOSE, CAPILLARY
Glucose-Capillary: 139 mg/dL — ABNORMAL HIGH (ref 70–99)
Glucose-Capillary: 161 mg/dL — ABNORMAL HIGH (ref 70–99)

## 2023-11-19 LAB — LIPOPROTEIN A (LPA): Lipoprotein (a): <8.4 nmol/L (ref <75.0)

## 2023-11-19 NOTE — Progress Notes (Signed)
  Progress Note  Patient Name: JDYN PARKERSON Date of Encounter: 11/19/2023 [facility] Cardiologist: None   Interval Summary   Patient feeling well this morning.  No chest pain or shortness of breath.  He was able to ambulate with cardiac rehab using his walker.  Vital Signs Vitals:   11/18/23 1924 11/18/23 2313 11/19/23 0315 11/19/23 0729  BP: 114/62 111/62 116/71 (!) 109/57  Pulse: 84 92 79 73  Resp: 17 20 18  (!) 25  Temp: 98.2 F (36.8 C) 98.2 F (36.8 C) 98.3 F (36.8 C) 98.6 F (37 C)  TempSrc: Oral Oral Oral Oral  SpO2: 91% 100% 93% 95%  Weight:      Height:        Intake/Output Summary (Last 24 hours) at 11/19/2023 0850 Last data filed at 11/19/2023 0630 Gross per 24 hour  Intake 360 ml  Output 2810 ml  Net -2450 ml      11/17/2023    5:00 AM 11/16/2023    8:09 AM 11/16/2023    8:00 AM  Last 3 Weights  Weight (lbs) 236 lb 15.9 oz 242 lb 242 lb  Weight (kg) 107.5 kg 109.77 kg 109.77 kg      Telemetry/ECG  Sinus rhythm without significant arrhythmia- Personally Reviewed  Physical Exam  GEN: No acute distress.   Neck: No JVD Cardiac: RRR, no murmurs, rubs, or gallops.  Right radial cath site clear with no hematoma or ecchymosis Respiratory: Clear to auscultation bilaterally. GI: Soft, nontender, non-distended  MS: No edema  Assessment & Plan  1.  Acute STEMI involving the RCA: Patient status post PCI, now on aspirin  and ticagrelor  as well as a high intensity statin drug.  Continue current management.  Medically stable for hospital discharge today. 2.  Cardiogenic shock: Initially required high dose vasopressor, now resolved.  Lactate has trended down and now normalized at 1.8 yesterday.  Blood pressure is stable ranging from 109-120/57 to 71 mmHg.  With soft blood pressures over the course of his hospitalization, and significant frailty, favor outpatient addition of medical therapy.  For now, would continue him on Farxiga  but avoid any GDMT that could  significantly lower his blood pressure.  Note post MI LVEF is 55%. 3.  Mixed hyperlipidemia: Triglycerides 248, LDL cholesterol 59.  Work on lifestyle modification, continue high intensity statin drug. 4.  Type 2 diabetes: Transition from glipizide to Farxiga .  Continue outpatient follow-up.  Disposition: Medically stable for discharge home today.     For questions or updates, please contact [facility] Please consult [email or web address] for contact info under       Signed, Ozell Fell, MD

## 2023-11-19 NOTE — Progress Notes (Signed)
 CARDIAC REHAB PHASE I   PRE:  Rate/Rhythm: 81 SR    BP: sitting 109/57    SpO2: 93 RA  MODE:  Ambulation: 130 ft   POST:  Rate/Rhythm: 111 St    BP: sitting 120/61     SpO2: 93 RA   Pt tolerated fairly well, c/o SOB after walk. General weakness PTA. Walked with RW, which he has at home but has not been using.   Discussed with pt MI, stent, Brilinta  importance, restrictions, tobacco cessation, diet, exercise guidelines, NTG, and CRPII. Pt voiced reception but is not ready for tobacco cessation (sts he has been dipping since age 27).  Resources given. Will refer to [facility] CRPII.  [id number] Aliene Aris BS, ACSM-CEP 11/19/2023 8:45 AM

## 2023-11-19 NOTE — Telephone Encounter (Signed)
 Advanced Heart Failure Patient Advocate Encounter  The patient was approved for a [employer] grant that will help cover the cost of Farxiga , Metoprolol .  Total amount awarded, $4,500.  Effective: 10/20/2023 - 10/18/2024.  BIN [id number] PCN [id number] Group [id number] ID [id number]  Pharmacy provided with approval and processing information. Patient informed while admitted.  Rachel DEL, CPhT Rx Patient Advocate Phone: [phone]

## 2023-11-19 NOTE — Progress Notes (Signed)
 PT Cancellation Note  Patient Details Name: Richard Reid MRN: [id number] DOB: [birth year]   Cancelled Treatment:    Reason Eval/Treat Not Completed: Other (comment) (Refused and stated he walked with therapy earlier. Will return as able.)   Stephane JULIANNA Bevel 11/19/2023, 1:16 PM Ava Tangney M,PT Acute Rehab Services [phone]

## 2023-11-19 NOTE — TOC CM/SW Note (Signed)
 Transition of Care [facility]) - Inpatient Brief Assessment   Patient Details  Name: Richard Reid MRN: [id number] Date of Birth: [birth year]  Transition of [facility] CM/SW Contact:    Lauraine FORBES Saa, LCSW Phone Number: 11/19/2023, 11:03 AM   Clinical Narrative:  11:03 AM Per chart review, patient resides at home with spouse and child(ren). Patient has a PCP and insurance. Patient does not have SNF/HH/DME history. Patient's preferred pharmacy's are [facility] and [facility] [phone] [place]. Physical therapy recommended patient to discharge home with [facility]. TOC will continue to follow and be available to assist.  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

## 2023-11-19 NOTE — Telephone Encounter (Signed)
 Pt discharged from [facility] today.  Triage nursing to place [facility] call to the pt on tomorrow 11/20/23.

## 2023-11-19 NOTE — Discharge Summary (Addendum)
 Discharge Summary   Patient ID: Richard Reid MRN: [id number]; DOB: 02/25/1946  Admit date: 11/16/2023 Discharge date: 11/19/2023  PCP:  Norval Kettle, MD   [facility] Providers Cardiologist:  None       Discharge Diagnoses  Principal Problem:   Acute ST elevation myocardial infarction (STEMI) of inferior wall [facility])   Diagnostic Studies/Procedures   11/16/23 LHC    Ost RCA to Prox RCA lesion is 99% stenosed.   Dist LAD-2 lesion is 70% stenosed.   Dist LAD-1 lesion is 70% stenosed.   A drug-eluting stent was successfully placed using a STENT ONYX FRONTIER 4.5X15.   Post intervention, there is a 0% residual stenosis.   Inferior STEMI secondary to thrombotic occlusion of the ostial RCA Successful PTCA/DES x 1 ostial RCA Distal LAD stent with diffuse restenosis leading into the apical LAD this is diffusely diseased and not a favorable target for stenting.  No obstructive disease in the Circumflex LVEDP=18 mmHg   Recommendations: Will admit to the ICU. Aggrastat  infusion for 1 hours. DAPT with ASA and Brilinta , high intensity statin. No beta blocker given hypotension. I did review his case with the Shock team. Right heart numbers are ok. He is requiring low dose Levophed  at the end of the case. No support device will be placed. Echo later today.  Diagnostic Dominance: Right  Intervention     11/17/23 TTE Limited  IMPRESSIONS     1. Limited images despite Definity  contrast.   2. Left ventricular ejection fraction, by estimation, is approximately  55%. The left ventricle has normal function. Left ventricular endocardial  border not optimally defined to evaluate regional wall motion. Left  ventricular diastolic parameters are  indeterminate.   3. Right ventricular systolic function is low normal based on limited  views. Tricuspid regurgitation signal is inadequate for assessing PA  pressure.   4. The inferior vena cava is normal in size with <50% respiratory   variability, suggesting right atrial pressure of 8 mmHg.   5. The mitral valve was not well visualized.   6. The aortic valve was not well visualized. Aortic valve regurgitation  is not visualized.   FINDINGS   Left Ventricle: Left ventricular ejection fraction, by estimation, is  55%. The left ventricle has normal function. Left ventricular endocardial  border not optimally defined to evaluate regional wall motion. Definity   contrast agent was given IV to  delineate the left ventricular endocardial borders. Left ventricular  diastolic parameters are indeterminate.   Right Ventricle: Right ventricular systolic function is low normal.  Tricuspid regurgitation signal is inadequate for assessing PA pressure.   Mitral Valve: The mitral valve was not well visualized.   Aortic Valve: The aortic valve was not well visualized. Aortic valve  regurgitation is not visualized. Aortic valve peak gradient measures 6.4  mmHg.   Aorta: The aortic root is normal in size and structure.   Venous: The inferior vena cava is normal in size with less than 50%  respiratory variability, suggesting right atrial pressure of 8 mmHg.   IAS/Shunts: No atrial level shunt detected by color flow Doppler.   _____________   History of Present Illness   Richard Reid is a 78 y.o. male with history of CAD, HLD, HTN who presented to the ED via EMS with c/o chest pain. EKG with inferior ST elevation. Pt has history of LAD stenting.  Hospital Course   Consultants: physical therapy, AHF   Inferior STEMI CAD Cardiogenic shock Hyperlipidemia Patient with hx  LAD stenting presented to the ED via EMS with chest pain and inferior ST elevations. He was taken urgently to the cath lab and found with 99% thrombotic occlusion of ostial to proximal RCA. Dist LAD-2 lesion 70% stenosed, dist LAD-1 lesion 70% stenosed. DES placed successfully in RCA, 0% residual stenosis. Admission complicated by lactic acidosis, initially  3.5. Patient also developed hypotension during LHC and norephinephrine started. RHC with preserved cardiac output, normal filling pressures. LVEDP 18 CO 4.6 CI 2. TTE x2 this admission. Initial imaging poor but with grossly normal LVEF. Repeat study more clear, LVEF 55%, low normal RV function. Patient initially admitted to [facility]. There he was able to be titrated off vasopressor support. Lactic trended down to 1.8 as of 7/6 and patient moved out of ICU. On day of discharge, patient with stable BP.  DAPT with ASA/Brilinta  Continue high intensity statin (LDL 59, goal 55). Consider adding zetia in follow up. Will defer aggressive GDMT given hypotension this admission and baseline frailty. Consider resuming home metoprolol , lisinopril, hydrochlorothiazide at follow up.  Hypertension Soft BP this admission in setting of STEMI. Consider resuming home metoprolol , lisinopril, hydrochlorothiazide at follow up. Held at discharge.  DM type II Transitioning from Glipizide to Farxiga . Patient to be provided with 30 day supply at d/c. Pharmacy attempting to qualify patient for grant. Continue Metformin.   Home health/PT During this admission, patient evaluated by PT due to noted falls at home. Per inpatient PT evaluation, Pt will benefit from acute skilled PT to increase their independence and safety with mobility to allow discharge. Home health PT orders placed at time of d/c.  Patient seen/evaluated by Dr. Wonda on 7/7 with patient deemed stable for d/c. Patient able to ambulate using his walker, feeling well.     Did the patient have an acute coronary syndrome (MI, NSTEMI, STEMI, etc) this admission?:  Yes                               AHA/ACC ACS Clinical Performance & Quality Measures: Aspirin  prescribed? - Yes ADP Receptor Inhibitor (Plavix/Clopidogrel, Brilinta /Ticagrelor  or Effient/Prasugrel) prescribed (includes medically managed patients)? - Yes Beta Blocker prescribed? - No - hypotension High  Intensity Statin (Lipitor 40-80mg  or Crestor 20-40mg ) prescribed? - Yes EF assessed during THIS hospitalization? - Yes For EF <40%, was ACEI/ARB prescribed? - Not Applicable (EF >/= 40%) For EF <40%, Aldosterone Antagonist (Spironolactone  or Eplerenone) prescribed? - Not Applicable (EF >/= 40%) Cardiac Rehab Phase II ordered (including medically managed patients)? - Yes       The patient will be scheduled for a TOC follow up appointment in the next 14 days.  A message has been sent to the [facility] and Scheduling Pool at the office where the patient should be seen for follow up.  _____________  Discharge Vitals Blood pressure 106/68, pulse 89, temperature 98.7 F (37.1 C), temperature source Oral, resp. rate 17, height 5' 9 (1.753 m), weight 107.5 kg, SpO2 94%.  Filed Weights   11/16/23 0800 11/16/23 0809 11/17/23 0500  Weight: 109.8 kg 109.8 kg 107.5 kg    Labs & Radiologic Studies  CBC Recent Labs    11/18/23 0250 11/19/23 0451  WBC 9.7 9.5  HGB 13.7 13.4  HCT 42.2 41.6  MCV 98.6 97.9  PLT 178 203   Basic Metabolic Panel Recent Labs    92/94/74 0405 11/18/23 0249 11/18/23 0250 11/19/23 0301  NA 135  --  134*  134*  K 3.9  --  3.7 4.2  CL 99  --  96* 101  CO2 25  --  24 22  GLUCOSE 182*  --  118* 129*  BUN 10  --  10 13  CREATININE 0.93  --  0.98 1.04  CALCIUM  9.2  --  8.8* 8.7*  MG 1.6* 2.0  --   --    Liver Function Tests No results for input(s): AST, ALT, ALKPHOS, BILITOT, PROT, ALBUMIN in the last 72 hours. No results for input(s): LIPASE, AMYLASE in the last 72 hours. High Sensitivity Troponin:   Recent Labs  Lab 11/16/23 0812 11/16/23 1248  TROPONINIHS 137* 3,293*    No results for input(s): TRNPT in the last 720 hours.  BNP Invalid input(s): POCBNP No results for input(s): PROBNP in the last 72 hours.  No results for input(s): BNP in the last 72 hours.  D-Dimer No results for input(s): DDIMER in the last 72  hours. Hemoglobin A1C No results for input(s): HGBA1C in the last 72 hours. Fasting Lipid Panel No results for input(s): CHOL, HDL, LDLCALC, TRIG, CHOLHDL, LDLDIRECT in the last 72 hours. Lipoprotein (a)  Date/Time Value Ref Range Status  11/17/2023 04:05 AM <8.4 <75.0 nmol/L Final    Comment:    (NOTE) **Results verified by repeat testing** This test was developed and its performance characteristics determined by [facility]. It has not been cleared or approved by the Food and Drug Administration. Note:  Values greater than or equal to 75.0 nmol/L may       indicate an independent risk factor for CHD,       but must be evaluated with caution when applied       to non-Caucasian populations due to the       influence of genetic factors on Lp(a) across       ethnicities. Performed At: [facility] [address] [place], [place] [id number] Jennette Shorter MD Ey:[id number]     Thyroid Function Tests Recent Labs    11/16/23 1248  TSH 3.826   _____________  ECHOCARDIOGRAM LIMITED Result Date: 11/17/2023    ECHOCARDIOGRAM LIMITED REPORT   Patient Name:   Richard Reid Date of Exam: 11/17/2023 Medical Rec #:  [id number]       Height:       69.0 in Accession #:    [id number]      Weight:       237.0 lb Date of Birth:  02/04/1946      BSA:          2.221 m Patient Age:    77 years        BP:           95/56 mmHg Patient Gender: M               HR:           73 bpm. Exam Location:  Inpatient Procedure: Limited Echo, Limited Color Doppler and Intracardiac Opacification            Agent (Both Spectral and Color Flow Doppler were utilized during            procedure). Indications:    CHF- Acute Systolic I50.21  History:        Patient has prior history of Echocardiogram examinations, most                 recent 11/16/2023. Acute MI, Signs/Symptoms:Shortness of Breath;  Risk Factors:Hypertension and Dyslipidemia.  Sonographer:    Thea Norlander RCS Referring Phys:  EZRA GORMAN SHUCK  Sonographer Comments: Technically difficult study due to poor echo windows, suboptimal apical window and suboptimal parasternal window. IMPRESSIONS  1. Limited images despite Definity  contrast.  2. Left ventricular ejection fraction, by estimation, is approximately 55%. The left ventricle has normal function. Left ventricular endocardial border not optimally defined to evaluate regional wall motion. Left ventricular diastolic parameters are indeterminate.  3. Right ventricular systolic function is low normal based on limited views. Tricuspid regurgitation signal is inadequate for assessing PA pressure.  4. The inferior vena cava is normal in size with <50% respiratory variability, suggesting right atrial pressure of 8 mmHg.  5. The mitral valve was not well visualized.  6. The aortic valve was not well visualized. Aortic valve regurgitation is not visualized. FINDINGS  Left Ventricle: Left ventricular ejection fraction, by estimation, is 55%. The left ventricle has normal function. Left ventricular endocardial border not optimally defined to evaluate regional wall motion. Definity  contrast agent was given IV to delineate the left ventricular endocardial borders. Left ventricular diastolic parameters are indeterminate. Right Ventricle: Right ventricular systolic function is low normal. Tricuspid regurgitation signal is inadequate for assessing PA pressure. Mitral Valve: The mitral valve was not well visualized. Aortic Valve: The aortic valve was not well visualized. Aortic valve regurgitation is not visualized. Aortic valve peak gradient measures 6.4 mmHg. Aorta: The aortic root is normal in size and structure. Venous: The inferior vena cava is normal in size with less than 50% respiratory variability, suggesting right atrial pressure of 8 mmHg. IAS/Shunts: No atrial level shunt detected by color flow Doppler. LEFT VENTRICLE PLAX 2D LVOT diam:     2.30 cm   Diastology LV SV:         72        LV e'  medial:    6.42 cm/s LV SV Index:   33        LV E/e' medial:  12.9 LVOT Area:     4.15 cm  LV e' lateral:   7.07 cm/s                          LV E/e' lateral: 11.7  RIGHT VENTRICLE            IVC RV S prime:     9.14 cm/s  IVC diam: 1.90 cm TAPSE (M-mode): 1.5 cm AORTIC VALVE AV Area (Vmax): 2.97 cm AV Vmax:        126.80 cm/s AV Peak Grad:   6.4 mmHg LVOT Vmax:      90.50 cm/s LVOT Vmean:     58.300 cm/s LVOT VTI:       0.174 m  AORTA Ao Root diam: 3.80 cm MITRAL VALVE MV Area (PHT): 3.31 cm    SHUNTS MV Decel Time: 229 msec    Systemic VTI:  0.17 m MV E velocity: 82.80 cm/s  Systemic Diam: 2.30 cm MV A velocity: 89.60 cm/s MV E/A ratio:  0.92 Jayson Sierras MD Electronically signed by Jayson Sierras MD Signature Date/Time: 11/17/2023/4:45:25 PM    Final    ECHOCARDIOGRAM COMPLETE Result Date: 11/16/2023    ECHOCARDIOGRAM REPORT   Patient Name:   Richard Reid Date of Exam: 11/16/2023 Medical Rec #:  [id number]       Height:       69.0 in Accession #:    [id number]  Weight:       242.0 lb Date of Birth:  1945/06/22      BSA:          2.240 m Patient Age:    77 years        BP:           111/60 mmHg Patient Gender: M               HR:           69 bpm. Exam Location:  Inpatient Procedure: 2D Echo, Cardiac Doppler, Color Doppler and Intracardiac            Opacification Agent (Both Spectral and Color Flow Doppler were            utilized during procedure). Indications:    CAD Native Vessel I25.10  History:        Patient has no prior history of Echocardiogram examinations.                 CAD; Risk Factors:Hypertension.  Sonographer:    Jayson Gaskins Referring Phys: 42 CHRISTOPHER D MCALHANY IMPRESSIONS  1. Grossly poor echo images despite Definity . No obvious LV thrombus.  2. Left ventricular ejection fraction is grossly normal. Left ventricular endocardial border is not optimally defined to evaluate regional wall motion. Left ventricular diastolic function could not be evaluated.  3. Right ventricular  systolic function was not well visualized. The right ventricular size is not well visualized.  4. The mitral valve was not well visualized. No evidence of mitral valve regurgitation. No evidence of mitral stenosis.  5. The aortic valve was not well visualized. Aortic valve regurgitation is not visualized. No aortic stenosis is present.  6. Limited interrogation of valves. Consider repeating limited echocardiogram or alternative modality for evaluation of valvular dysfunction. FINDINGS  Left Ventricle: Left ventricular ejection fraction, by estimation, is 60 to 65%. The left ventricle has normal function. Left ventricular endocardial border not optimally defined to evaluate regional wall motion. Strain was performed and the global longitudinal strain is indeterminate. The left ventricular internal cavity size was normal in size. There is no left ventricular hypertrophy. Left ventricular diastolic function could not be evaluated due to nondiagnostic images. Left ventricular diastolic function could not be evaluated. Right Ventricle: The right ventricular size is not well visualized. Right vetricular wall thickness was not well visualized. Right ventricular systolic function was not well visualized. Left Atrium: Left atrial size was not well visualized. Right Atrium: Right atrial size was not well visualized. Pericardium: There is no evidence of pericardial effusion. Mitral Valve: The mitral valve was not well visualized. No evidence of mitral valve regurgitation. No evidence of mitral valve stenosis. Tricuspid Valve: The tricuspid valve is not well visualized. Tricuspid valve regurgitation is not demonstrated. No evidence of tricuspid stenosis. Aortic Valve: The aortic valve was not well visualized. Aortic valve regurgitation is not visualized. No aortic stenosis is present. Aortic valve mean gradient measures 7.0 mmHg. Aortic valve peak gradient measures 11.7 mmHg. Aortic valve area, by VTI measures 2.03 cm.  Pulmonic Valve: The pulmonic valve was not well visualized. Pulmonic valve regurgitation is not visualized. No evidence of pulmonic stenosis. Aorta: The aortic root was not well visualized and the ascending aorta was not well visualized. Venous: The inferior vena cava was not well visualized. IAS/Shunts: The interatrial septum was not well visualized. Additional Comments: 3D was performed not requiring image post processing on an independent workstation and was indeterminate.  LEFT VENTRICLE PLAX  2D LVOT diam:     2.00 cm   Diastology LV SV:         83        LV e' medial:    4.79 cm/s LV SV Index:   37        LV E/e' medial:  14.0 LVOT Area:     3.14 cm  LV e' lateral:   8.92 cm/s                          LV E/e' lateral: 7.5  LEFT ATRIUM           Index LA Vol (A4C): 42.5 ml 18.97 ml/m  AORTIC VALVE AV Area (Vmax):    2.22 cm AV Area (Vmean):   2.22 cm AV Area (VTI):     2.03 cm AV Vmax:           171.00 cm/s AV Vmean:          126.000 cm/s AV VTI:            0.411 m AV Peak Grad:      11.7 mmHg AV Mean Grad:      7.0 mmHg LVOT Vmax:         121.00 cm/s LVOT Vmean:        88.900 cm/s LVOT VTI:          0.265 m LVOT/AV VTI ratio: 0.64 MITRAL VALVE MV Area (PHT): 3.08 cm    SHUNTS MV Decel Time: 246 msec    Systemic VTI:  0.26 m MV E velocity: 67.10 cm/s  Systemic Diam: 2.00 cm MV A velocity: 99.60 cm/s MV E/A ratio:  0.67 Vishnu Priya Mallipeddi Electronically signed by Diannah Late Mallipeddi Signature Date/Time: 11/16/2023/4:37:57 PM    Final    CARDIAC CATHETERIZATION Addendum Date: 11/16/2023   Ost RCA to Prox RCA lesion is 99% stenosed.   Dist LAD-2 lesion is 70% stenosed.   Dist LAD-1 lesion is 70% stenosed.   A drug-eluting stent was successfully placed using a STENT ONYX FRONTIER 4.5X15.   Post intervention, there is a 0% residual stenosis. Inferior STEMI secondary to thrombotic occlusion of the ostial RCA Successful PTCA/DES x 1 ostial RCA Distal LAD stent with diffuse restenosis leading into the  apical LAD this is diffusely diseased and not a favorable target for stenting. No obstructive disease in the Circumflex LVEDP=18 mmHg Recommendations: Will admit to the ICU. Aggrastat  infusion for 1 hours. DAPT with ASA and Brilinta , high intensity statin. No beta blocker given hypotension. I did review his case with the Shock team. Right heart numbers are ok. He is requiring low dose Levophed  at the end of the case. No support device will be placed. Echo later today.   Result Date: 11/16/2023   Ost RCA to Prox RCA lesion is 99% stenosed.   Dist LAD-2 lesion is 70% stenosed.   Dist LAD-1 lesion is 70% stenosed.   A drug-eluting stent was successfully placed using a STENT ONYX FRONTIER 4.5X15.   Post intervention, there is a 0% residual stenosis. Inferior STEMI secondary to thrombotic occlusion of the ostial RCA Successful PTCA/DES x 1 ostial RCA Distal LAD stent with diffuse restenosis leading into the apical LAD this is diffusely diseased and not a favorable target for stenting. No obstructive disease in the Circumflex LVEDP=18 mmHg Recommendations: Will admit to the ICU. Aggrastat  infusion for 1 hours. DAPT with ASA and Brilinta , high intensity statin. No beta  blocker given hypotension. I did review his case with the Shock team. Right heart numbers are ok. He is requiring low dose Levophed  at the end of the case. No support device will be placed. Echo later today.   DG Chest Port 1 View Result Date: 11/16/2023 CLINICAL DATA:  Chest pain. EXAM: PORTABLE CHEST 1 VIEW COMPARISON:  06/19/2023 FINDINGS: Low volume film. The lungs are clear without focal pneumonia, edema, pneumothorax or pleural effusion. The cardio pericardial silhouette is enlarged. No acute bony abnormality. Telemetry leads overlie the chest. IMPRESSION: Low volume film without acute cardiopulmonary findings. Electronically Signed   By: Camellia Candle M.D.   On: 11/16/2023 08:22    Disposition Pt is being discharged home today in good  condition.  Follow-up Plans & Appointments  Follow-up Information     [facility] Follow up.   Specialty: Home Health Services Why: Agency will call you to set up apt times Contact information: [address] [place] [address] [phone]                Discharge Instructions     Amb Referral to Cardiac Rehabilitation   Complete by: As directed    Diagnosis:  Coronary Stents PTCA STEMI     After initial evaluation and assessments completed: Virtual Based Care may be provided alone or in conjunction with Phase 2 Cardiac Rehab based on patient barriers.: Yes   Intensive Cardiac Rehabilitation (ICR) [facility] location only OR Traditional Cardiac Rehabilitation (TCR) *If criteria for ICR are not met will enroll in TCR [facility] only): Yes   Face-to-face encounter (required for Medicare/Medicaid patients)   Complete by: As directed    I Artist Pouch certify that this patient was under the care of myself and Dr. Wonda. Dr. Wonda had a face-to-face encounter that meets the physician face-to-face encounter requirements with this patient on 11/19/2023. The encounter with the patient was in whole, or in part for the following medical condition(s) which is the primary reason for home health care (List medical condition): chronic weakness.  Per PT:  Patient needs PT for help with walking and/or transfers;A little help with bathing/dressing/bathroom;Assistance with cooking/housework;Assist for transportation;Help with stairs or ramp for entrance.   The encounter with the patient was in whole, or in part, for the following medical condition, which is the primary reason for home health care: chronic weakness and acute ST elevation myocardial infarction   I certify that, based on my findings, the following services are medically necessary home health services: Physical therapy   Reason for Medically Necessary Home Health Services: Therapy- Therapeutic Exercises to Increase  Strength and Endurance   My clinical findings support the need for the above services: Unsafe ambulation due to balance issues   Further, I certify that my clinical findings support that this patient is homebound due to: Unable to leave home safely without assistance   Home Health   Complete by: As directed    To provide the following care/treatments: PT       Discharge Medications Allergies as of 11/19/2023       Reactions   Morphine And Codeine Other (See Comments)   Skin burns        Medication List     PAUSE taking these medications    hydrochlorothiazide 25 MG tablet Wait to take this until your doctor or other care provider tells you to start again. Commonly known as: HYDRODIURIL Take 25 mg by mouth daily.   lisinopril 20 MG  tablet Wait to take this until your doctor or other care provider tells you to start again. Commonly known as: ZESTRIL Take 20 mg by mouth at bedtime.   metoprolol  succinate 100 MG 24 hr tablet Wait to take this until your doctor or other care provider tells you to start again. Commonly known as: TOPROL -XL Take 100 mg by mouth daily. Take with or immediately following a meal.       STOP taking these medications    glipiZIDE 10 MG tablet Commonly known as: GLUCOTROL   simvastatin  20 MG tablet Commonly known as: ZOCOR        TAKE these medications    aspirin  EC 81 MG tablet Take 1 tablet (81 mg total) by mouth daily. Swallow whole. What changed:  how much to take when to take this reasons to take this additional instructions   atorvastatin  80 MG tablet Commonly known as: LIPITOR Take 1 tablet (80 mg total) by mouth daily.   Farxiga  10 MG Tabs tablet Generic drug: dapagliflozin  propanediol Take 1 tablet (10 mg total) by mouth daily before breakfast.   levothyroxine  25 MCG tablet Commonly known as: SYNTHROID  Take 25 mcg by mouth daily.   metFORMIN 1000 MG tablet Commonly known as: GLUCOPHAGE Take 1,000 mg by mouth 2  (two) times daily.   nitroGLYCERIN  0.4 MG SL tablet Commonly known as: Nitrostat  Place 1 tablet (0.4 mg total) under the tongue every 5 (five) minutes as needed for chest pain.   ondansetron  4 MG disintegrating tablet Commonly known as: ZOFRAN -ODT Take 1 tablet (4 mg total) by mouth every 8 (eight) hours as needed.   ticagrelor  90 MG Tabs tablet Commonly known as: BRILINTA  Take 1 tablet (90 mg total) by mouth 2 (two) times daily.         Outstanding Labs/Studies N/A  Duration of Discharge Encounter: APP Time: 25 minutes   Signed, Artist Pouch, PA-C 11/19/2023, 11:52 AM   The patient is independently interviewed and examined.  I agree with the clinical findings, assessment, and plan as outlined above by Artist Pouch, PA-C.  Findings from my exam today are below:  Vitals:   11/19/23 0729 11/19/23 0900  BP: (!) 109/57 106/68  Pulse: 73 89  Resp: (!) 25 17  Temp: 98.6 F (37 C) 98.7 F (37.1 C)  SpO2: 95% 94%     Pt is alert and oriented, NAD HEENT: normal Neck: JVP - normal Lungs: CTA bilaterally CV: RRR without murmur or gallop Abd: soft, NT, Positive BS, no hepatomegaly Ext: no C/C/E, distal pulses intact and equal. Right radial site(s) clear Skin: warm/dry no rash  MD time spent conducting this discharge is 28 minutes.  This includes my personal examination of the patient, review of vital signs, labs, echo findings, cath findings, radiographic images, and discussion of post Hospital restrictions/recovery as well as recommendations for outpatient follow-up. See my rounding note from this same date for further details.   Ozell Fell 11/19/2023 4:19 PM

## 2023-11-19 NOTE — TOC Transition Note (Signed)
 Transition of Care [facility]) - Discharge Note   Patient Details  Name: Richard Reid MRN: [id number] Date of Birth: [birth year]  Transition of Care [facility]) CM/SW Contact:  Waddell Barnie Rama, RN Phone Number: 11/19/2023, 11:25 AM   Clinical Narrative:    For dc today, NCM offered choice for HH, he does not have a preference.  NCM made referral to [facility] with [facility].  He is able to take referral.  Soc will begin 24 to 48 hrs post dc.          Patient Goals and CMS Choice            Discharge Placement                       Discharge Plan and Services Additional resources added to the After Visit Summary for                                       Social Drivers of Health (SDOH) Interventions SDOH Screenings   Food Insecurity: No Food Insecurity (11/17/2023)  Housing: Unknown (11/17/2023)  Transportation Needs: No Transportation Needs (11/17/2023)  Utilities: Not At Risk (11/17/2023)  Social Connections: Moderately Integrated (11/17/2023)  Tobacco Use: High Risk (11/16/2023)     Readmission Risk Interventions     No data to display

## 2023-11-19 NOTE — Care Management Important Message (Signed)
 Important Message  Patient Details  Name: Richard Reid MRN: [id number] Date of Birth: 1945/12/27   Important Message Given:  Yes - Medicare IM     [name] 11/19/2023, 3:02 PM

## 2023-11-19 NOTE — Telephone Encounter (Signed)
 Patient Product/process development scientist completed.    The patient is insured through [facility]. Patient has Medicare and is not eligible for a copay card, but may be able to apply for patient assistance or Medicare RX Payment Plan (Patient Must reach out to their plan, if eligible for payment plan), if available.    Ran test claim for Jardiance 10 mg and the current 30 day co-pay is $302.00 due to a $255.00 deductible.  Will be $47.00 once deductible is met.   This test claim was processed through [facility]- copay amounts may vary at other pharmacies due to pharmacy/plan contracts, or as the patient moves through the different stages of their insurance plan.     Roland Earl, CPHT Pharmacy Technician III Certified Patient Advocate [facility] Pharmacy Patient Advocate Team Direct Number: [phone]  Fax: [phone]

## 2023-11-19 NOTE — Telephone Encounter (Signed)
   Transition of Care Follow-up Phone Call Request    Patient Name: Richard Reid Date of Birth: 1945/11/18 Date of Encounter: 11/19/2023  Primary Care Provider:  Norval Kettle, MD Primary Cardiologist:  None  [name] has been scheduled for a transition of care follow up appointment with a [facility] provider:  Freeman Alberts, NP 11/30/23 at 2:45pm  Please reach out to [name] within 48 hours of discharge to confirm appointment and review transition of care protocol questionnaire. Anticipated discharge date: 11/19/23  Artist Pouch, PA-C  11/19/2023, 11:51 AM

## 2023-11-20 NOTE — Telephone Encounter (Signed)
 No answer on mobile number. Home number - call went directly to VM.  Left message to call back.

## 2023-11-21 ENCOUNTER — Other Ambulatory Visit (HOSPITAL_COMMUNITY): Payer: Self-pay

## 2023-11-22 NOTE — Telephone Encounter (Addendum)
 2nd attempt Home number not working.  Left message on cell phone to call office back.

## 2023-11-23 ENCOUNTER — Other Ambulatory Visit (HOSPITAL_COMMUNITY): Payer: Self-pay

## 2023-11-23 ENCOUNTER — Telehealth: Payer: Self-pay

## 2023-11-23 MED ORDER — TICAGRELOR 90 MG PO TABS
90.0000 mg | ORAL_TABLET | Freq: Two times a day (BID) | ORAL | 11 refills | Status: DC
  Filled 2023-11-23 (×2): qty 60, 30d supply, fill #0
  Filled 2023-12-21: qty 60, 30d supply, fill #1
  Filled 2024-01-22: qty 60, 30d supply, fill #2
  Filled 2024-02-27 (×2): qty 60, 30d supply, fill #3
  Filled 2024-04-15: qty 60, 30d supply, fill #4

## 2023-11-23 MED ORDER — NITROGLYCERIN 0.4 MG SL SUBL
SUBLINGUAL_TABLET | SUBLINGUAL | 2 refills | Status: AC
  Filled 2023-11-23: qty 25, 8d supply, fill #0

## 2023-11-23 MED ORDER — TICAGRELOR 60 MG PO TABS
60.0000 mg | ORAL_TABLET | Freq: Two times a day (BID) | ORAL | 11 refills | Status: DC
  Filled 2023-11-23: qty 60, 30d supply, fill #0

## 2023-11-23 MED ORDER — DAPAGLIFLOZIN PROPANEDIOL 10 MG PO TABS
10.0000 mg | ORAL_TABLET | Freq: Every day | ORAL | 11 refills | Status: DC
  Filled 2023-11-23: qty 30, 30d supply, fill #0
  Filled 2023-11-23 (×2): qty 30, 30d supply, fill #1

## 2023-11-23 NOTE — Telephone Encounter (Signed)
 Patient contacted regarding discharge Jolynn Pack on 11/19/23   Patient understands to follow up with Jackee Alberts on 11/30/23 at 2:45 pm at [facility] Patient understands discharge instructions? Yes   Patient understands medications and regiment? Yes  Patient understands to bring all medications to this visit? Yes He reports wrist cath site is CDI, small raised area at insertion site.  No Brilinta  since the morning he was dc'd from hospital (11/19/23)  Spouse went to pick up but the cost was too much.  Also did not get Farxiga  until today when she came into the [facility].  Note in chart indicates pt was informed of being approved for a grant for medications. I told patient's wife we will call her back and to plan on coming back to the pharmacy today after we call her.  The office has no samples of Brilinta  and no copay cards available.  I checked with PharmD who is doing a PA for generic Brilinta .

## 2023-11-23 NOTE — Telephone Encounter (Signed)
 Brilinta  cost was high due to deductible. He has [employer] grant for farxiga . I asked [facility] pharmacy to fill his Farxiga  so that the [employer] grant would pay his deductible, then the cost of Brilinta  would come down to $47/ month

## 2023-11-23 NOTE — Telephone Encounter (Signed)
 Pharmacy Patient Advocate Encounter  Insurance verification completed.   The patient is insured through [employer] test claim for TICAGRELOR  90 MG TAB. Currently a quantity of 60 is a 30 day supply and the co-pay is NA . PRODUCT/SERVICE NOT COVERED-PLAN BENEFIT EXCLUSION BRILINTA  PREFERRED (ALSO PREFERS TO COVER CLOPIDOGREL 75 MG AND PRASUGREL)   This test claim was processed through [facility]- copay amounts may vary at other pharmacies due to pharmacy/plan contracts, or as the patient moves through the different stages of their insurance plan.

## 2023-11-23 NOTE — Telephone Encounter (Signed)
 Pharmacy Patient Advocate Encounter  Insurance verification completed.   The patient is insured through [employer] test claim for BRILINTA . Currently a quantity of 60 is a 30 day supply and the co-pay is $302 . The current 30 day co-pay is, $302.  No PA needed at this time.  This test claim was processed through [facility]- copay amounts may vary at other pharmacies due to pharmacy/plan contracts, or as the patient moves through the different stages of their insurance plan.     CLAIM DETAILS SHOW $255 WENT TO DEDUCTIBLE. COST AFTER DEDUCTIBLE IS MET $47

## 2023-11-23 NOTE — Telephone Encounter (Signed)
 Per PharmD Brilinta  needed PA and the cost is now $47.  This is affordable for the patient.  Mrs. Arrick will come by this afternoon to get the medication and make sure patient receives a dose this evening. She is aware it is to be taken BID.  Pt very grateful for assistance.

## 2023-11-30 ENCOUNTER — Ambulatory Visit: Admitting: Nurse Practitioner

## 2023-12-12 ENCOUNTER — Other Ambulatory Visit (HOSPITAL_COMMUNITY): Payer: Self-pay

## 2023-12-12 ENCOUNTER — Ambulatory Visit: Attending: Cardiology | Admitting: Physician Assistant

## 2023-12-12 ENCOUNTER — Encounter: Payer: Self-pay | Admitting: Physician Assistant

## 2023-12-12 VITALS — BP 104/68 | HR 112 | Ht 69.5 in | Wt 227.0 lb

## 2023-12-12 DIAGNOSIS — I2119 ST elevation (STEMI) myocardial infarction involving other coronary artery of inferior wall: Secondary | ICD-10-CM | POA: Diagnosis not present

## 2023-12-12 DIAGNOSIS — R0602 Shortness of breath: Secondary | ICD-10-CM

## 2023-12-12 DIAGNOSIS — I10 Essential (primary) hypertension: Secondary | ICD-10-CM | POA: Diagnosis not present

## 2023-12-12 DIAGNOSIS — E7800 Pure hypercholesterolemia, unspecified: Secondary | ICD-10-CM

## 2023-12-12 MED ORDER — METOPROLOL TARTRATE 25 MG PO TABS
12.5000 mg | ORAL_TABLET | Freq: Two times a day (BID) | ORAL | 3 refills | Status: DC
  Filled 2023-12-12: qty 90, 90d supply, fill #0

## 2023-12-12 NOTE — Patient Instructions (Signed)
 Medication Instructions:  Your physician has recommended you make the following change in your medication:   START Metoprolol  25 mg taking 1/2 tablet twice a day  *If you need a refill on your cardiac medications before your next appointment, please call your pharmacy*  Lab Work: TODAY:  BMET, PRO BNP, DIRECT LDL, & ALT  If you have labs (blood work) drawn today and your tests are completely normal, you will receive your results only by: MyChart Message (if you have MyChart) OR A paper copy in the mail If you have any lab test that is abnormal or we need to change your treatment, we will call you to review the results.  Testing/Procedures: None ordered  Follow-Up: At [facility], you and your health needs are our priority.  As part of our continuing mission to provide you with exceptional heart care, our providers are all part of one team.  This team includes your primary Cardiologist (physician) and Advanced Practice Providers or APPs (Physician Assistants and Nurse Practitioners) who all work together to provide you with the care you need, when you need it.  Your next appointment:   4 week(s)  Provider:   Glendia Ferrier, PA-C          We recommend signing up for the patient portal called MyChart.  Sign up information is provided on this After Visit Summary.  MyChart is used to connect with patients for Virtual Visits (Telemedicine).  Patients are able to view lab/test results, encounter notes, upcoming appointments, etc.  Non-urgent messages can be sent to your provider as well.   To learn more about what you can do with MyChart, go to [email or web address].   Other Instructions

## 2023-12-12 NOTE — Assessment & Plan Note (Signed)
 Blood pressure controlled.  As noted, I will try to reinitiate GDMT to help control his heart rate better with metoprolol  tartrate 12.5 mg twice daily.  If his blood pressure increases further, we can consider resuming his ACE inhibitor and thiazide diuretic.

## 2023-12-12 NOTE — Assessment & Plan Note (Signed)
 History of remote stenting to the LAD in the past.  Recent admission earlier this month with inferior STEMI treated with DES to the RCA.  He had distal LAD and apical LAD stenosis not amenable to PCI and managed medically.  He developed cardiogenic shock after catheterization requiring vasopressors.  GDMT was limited due to low blood pressures.  HCTZ, lisinopril and metoprolol  succinate were all discontinued.  He is not having chest symptoms to suggest angina.  He does note shortness of breath with exertion.  Exam does not suggest volume overload but question if this may be contributing.  His heart rate is also elevated.  I suspect that this is related to being off his beta-blocker.  This may also be contributing.  He did have difficulty getting his Brilinta  but is on it now.  We discussed the importance of uninterrupted dual antiplatelet therapy.  He is currently being seen by home health physical therapy.  Once he is finished with HHPT, he can proceed to outpatient cardiac rehab. Of note, pt had visible bugs crawling on him. Review of his chart indicates he had bed bugs noted during his hospital stay.  -Start metoprolol  tartrate 12.5 mg twice daily -Continue ASA 81 mg daily, Lipitor 80 mg daily, Brilinta  90 mg twice daily -Follow-up 4 weeks

## 2023-12-12 NOTE — Progress Notes (Signed)
 OFFICE NOTE:    Date:  12/12/2023  ID:  Richard Reid, DOB 04/10/1946, MRN [id number] PCP: Norval Kettle, MD  [facility] Providers Cardiologist:  Lonni Cash, MD Cardiology APP:  Lelon Glendia DASEN, PA-C        Coronary artery disease  Hx of stent to mLAD Inf STEMI 11/2023 s/p 4.5 x 15 mm DES to [facility] LHC 11/16/2023: Distal LAD stent 70 ISR, apical LAD 70 (not amenable to PCI), ostial-proximal RCA 99 thrombotic (PCI) TTE 11/16/2023: No obvious LV clot, poor images Limited TTE 11/17/2023: EF 55, low normal RVSF Hypertension  Hyperlipidemia  Diabetes mellitus        Discussed the use of AI scribe software for clinical note transcription with the patient, who gave verbal consent to proceed. History of Present Illness Richard Reid is a 78 y.o. male who returns for post hospital follow up.  He was last seen in 2013.  He was admitted 7/4-7/7 with an acute inferior ST elevation myocardial infarction.  Emergent cardiac catheterization demonstrated 99% ostial proximal RCA thrombotic occlusion treated with DES.  Distal LAD stent had diffuse restenosis leading into the apical LAD.  This was not favorable for stenting medical therapy was recommended.  Hospitalization was complicated by cardiogenic shock requiring high-dose vasopressors.  Limited echo demonstrated EF 55% with low normal RV function.  Lactate trended down.  Vasopressors were titrated off.  He was previously on metoprolol , lisinopril and HCTZ.  These were held due to low blood pressures.  Consider resuming at follow-up.  Of note, he was taken off of glipizide and placed on Farxiga .  Patient noted frequent falls at home prior to admission.  He was evaluated by PT and home health PT was recommended.  He is here with his wife.  He experiences shortness of breath, particularly when walking long distances or even short distances such as from the waiting room to the examination room. He reports that his shortness of breath is  about the same as it was since his recent hospitalization for a heart attack. No shortness of breath was noted prior to the heart attack. He has difficulty sleeping flat in bed, preferring to sleep on the couch in an upright position due to discomfort and back pain when lying flat. He denies waking up suddenly out of breath and is unsure about leg swelling.  Of note, he could not get Brilinta  for 4 days after discharge.  He has been taking it every day since.  He has not had chest discomfort, syncope, palpitations.   Review of Systems  Gastrointestinal:  Negative for hematochezia and melena.  Genitourinary:  Negative for hematuria.  -See HPI    Studies Reviewed:  EKG Interpretation Date/Time:  Wednesday December 12 2023 14:14:39 EDT Ventricular Rate:  112 PR Interval:  172 QRS Duration:  88 QT Interval:  344 QTC Calculation: 469 R Axis:   230  Text Interpretation: Sinus tachycardia Possible Right ventricular hypertrophy Inferior infarct Anterolateral infarct Confirmed by Lelon Glendia [phone]) on 12/12/2023 2:28:39 PM          Physical Exam:  VS:  BP 104/68   Pulse (!) 112   Ht 5' 9.5 (1.765 m)   Wt 227 lb (103 kg)   SpO2 94%   BMI 33.04 kg/m       Wt Readings from Last 3 Encounters:  12/12/23 227 lb (103 kg)  11/17/23 236 lb 15.9 oz (107.5 kg)  06/19/23 254 lb (115.2 kg)  Constitutional:      Appearance: Healthy appearance. Not in distress.  Neck:     Vascular: JVD normal.  Pulmonary:     Breath sounds: Normal breath sounds. No wheezing. No rales.  Cardiovascular:     Tachycardia present. Regular rhythm.     Murmurs: There is no murmur.  Edema:    Peripheral edema absent.  Abdominal:     Palpations: Abdomen is soft.       Assessment and Plan:    Assessment & Plan Acute ST elevation myocardial infarction (STEMI) of inferior wall (HCC) Shortness of breath History of remote stenting to the LAD in the past.  Recent admission earlier this month with inferior STEMI  treated with DES to the RCA.  He had distal LAD and apical LAD stenosis not amenable to PCI and managed medically.  He developed cardiogenic shock after catheterization requiring vasopressors.  GDMT was limited due to low blood pressures.  HCTZ, lisinopril and metoprolol  succinate were all discontinued.  He is not having chest symptoms to suggest angina.  He does note shortness of breath with exertion.  Exam does not suggest volume overload but question if this may be contributing.  His heart rate is also elevated.  I suspect that this is related to being off his beta-blocker.  This may also be contributing.  He did have difficulty getting his Brilinta  but is on it now.  We discussed the importance of uninterrupted dual antiplatelet therapy.  He is currently being seen by home health physical therapy.  Once he is finished with HHPT, he can proceed to outpatient cardiac rehab. Of note, pt had visible bugs crawling on him. Review of his chart indicates he had bed bugs noted during his hospital stay.  -Start metoprolol  tartrate 12.5 mg twice daily -Continue ASA 81 mg daily, Lipitor 80 mg daily, Brilinta  90 mg twice daily -Follow-up 4 weeks Primary hypertension Blood pressure controlled.  As noted, I will try to reinitiate GDMT to help control his heart rate better with metoprolol  tartrate 12.5 mg twice daily.  If his blood pressure increases further, we can consider resuming his ACE inhibitor and thiazide diuretic. Pure hypercholesterolemia Goal LDL <70.  Continue Lipitor 80 mg daily.  Obtain direct LDL and ALT today.      Cardiac Rehabilitation Eligibility Assessment  The patient is NOT ready to start cardiac rehabilitation due to: The patient is currently in a SNF or HHPT program.     Dispo:  Return in about 4 weeks (around 01/09/2024) for Routine Follow Up, w/ Glendia Ferrier, PA-C.  Signed, Glendia Ferrier, PA-C

## 2023-12-12 NOTE — Assessment & Plan Note (Signed)
 Goal LDL <70.  Continue Lipitor 80 mg daily.  Obtain direct LDL and ALT today.

## 2023-12-13 ENCOUNTER — Ambulatory Visit: Payer: Self-pay | Admitting: Physician Assistant

## 2023-12-13 LAB — PRO B NATRIURETIC PEPTIDE: NT-Pro BNP: 263 pg/mL (ref 0–486)

## 2023-12-13 LAB — BASIC METABOLIC PANEL WITH GFR
BUN/Creatinine Ratio: 11 (ref 10–24)
BUN: 12 mg/dL (ref 8–27)
CO2: 22 mmol/L (ref 20–29)
Calcium: 10.1 mg/dL (ref 8.6–10.2)
Chloride: 99 mmol/L (ref 96–106)
Creatinine, Ser: 1.05 mg/dL (ref 0.76–1.27)
Glucose: 149 mg/dL — ABNORMAL HIGH (ref 70–99)
Potassium: 4.5 mmol/L (ref 3.5–5.2)
Sodium: 140 mmol/L (ref 134–144)
eGFR: 74 mL/min/1.73 (ref >59)

## 2023-12-13 LAB — ALT: ALT: 14 IU/L (ref 0–44)

## 2023-12-13 LAB — LDL CHOLESTEROL, DIRECT: LDL Direct: 47 mg/dL (ref 0–99)

## 2023-12-20 ENCOUNTER — Ambulatory Visit: Admitting: Podiatry

## 2023-12-20 VITALS — Ht 69.5 in | Wt 227.0 lb

## 2023-12-20 DIAGNOSIS — B351 Tinea unguium: Secondary | ICD-10-CM | POA: Diagnosis not present

## 2023-12-20 DIAGNOSIS — M79675 Pain in left toe(s): Secondary | ICD-10-CM

## 2023-12-20 DIAGNOSIS — M79674 Pain in right toe(s): Secondary | ICD-10-CM

## 2023-12-20 NOTE — Progress Notes (Signed)
  Subjective:  Patient ID: Richard Reid, male    DOB: 11-Feb-1946,  MRN: [id number]  Chief Complaint  Patient presents with   Nail Problem    RM 19 Patient is here routine foot care and nail trimming.     78 y.o. male presents with the above complaint. History confirmed with patient.  He has diabetes that is controlled.  Used to see Dr. Christine for diabetic footcare, his nails are thickened elongated and cause discomfort and he and his wife are unable to cut them  Objective:  Physical Exam: warm, good capillary refill, no active ulcerative lesions, normal DP and PT pulses, normal sensory exam, and varicosities noted bilateral. Left Foot: dystrophic yellowed discolored nail plates with subungual debris Right Foot: dystrophic yellowed discolored nail plates with subungual debris  Assessment:   1. Pain due to onychomycosis of toenails of both feet      Plan:  Patient was evaluated and treated and all questions answered.  Discussed the etiology and treatment options for the condition in detail with the patient. Recommended debridement of the nails today. Sharp and mechanical debridement performed of all painful and mycotic nails today. Nails debrided in length and thickness using a nail nipper to level of comfort. Follow up as needed for painful nails.    Return in about 3 months (around 03/21/2024) for at risk diabetic foot care.

## 2023-12-21 ENCOUNTER — Other Ambulatory Visit (HOSPITAL_COMMUNITY): Payer: Self-pay

## 2024-01-09 ENCOUNTER — Other Ambulatory Visit (HOSPITAL_COMMUNITY): Payer: Self-pay

## 2024-01-09 ENCOUNTER — Ambulatory Visit: Attending: Cardiology | Admitting: Physician Assistant

## 2024-01-09 ENCOUNTER — Encounter: Payer: Self-pay | Admitting: Physician Assistant

## 2024-01-09 VITALS — BP 130/80 | HR 116 | Ht 69.5 in | Wt 222.8 lb

## 2024-01-09 DIAGNOSIS — E7800 Pure hypercholesterolemia, unspecified: Secondary | ICD-10-CM | POA: Diagnosis not present

## 2024-01-09 DIAGNOSIS — I10 Essential (primary) hypertension: Secondary | ICD-10-CM

## 2024-01-09 DIAGNOSIS — I2510 Atherosclerotic heart disease of native coronary artery without angina pectoris: Secondary | ICD-10-CM | POA: Diagnosis not present

## 2024-01-09 MED ORDER — METOPROLOL SUCCINATE ER 100 MG PO TB24: 100.0000 mg | ORAL_TABLET | Freq: Every day | ORAL | Status: AC

## 2024-01-09 NOTE — Progress Notes (Signed)
 OFFICE NOTE:    Date:  01/09/2024  ID:  Richard Reid, DOB 1946/03/15, MRN [id number] PCP: Luvenia Badder, NP   [facility] Providers Cardiologist:  Lonni Cash, MD Cardiology APP:  Lelon Glendia DASEN, PA-C       Coronary artery disease  Hx of stent to mLAD Inf STEMI 11/2023 s/p 4.5 x 15 mm DES to [facility] LHC 11/16/2023: Distal LAD stent 70 ISR, apical LAD 70 (not amenable to PCI), ostial-proximal RCA 99 thrombotic (PCI) TTE 11/16/2023: No obvious LV clot, poor images Limited TTE 11/17/2023: EF 55, low normal RVSF Hypertension  Hyperlipidemia  Diabetes mellitus       Discussed the use of AI scribe software for clinical note transcription with the patient, who gave verbal consent to proceed. History of Present Illness Richard Reid is a 78 y.o. male who returns for follow up of CAD. He was last seen 12/12/23 after an admission for inferior STEMI c/b CG shock. EF was normal. Metoprolol , Lisinopril and hydrochlorothiazide were held at DC due to soft BP. At his last visit, his HR was elevated. I restarted Metoprolol  tartrate 12.5 mg twice daily.  He experiences shortness of breath when walking long distances, which he describes as 'halfway better' since his recent hospital visit. No pain or pressure is present, and he denies any swelling in his legs or episodes of syncope. He does not lay flat to sleep but instead uses a recliner, a habit maintained since his neck surgery. He has completed home health therapy and is awaiting contact for cardiac rehabilitation. He does not have a blood pressure machine at home.   ROS-See HPI    Studies Reviewed:             Physical Exam:  VS:  BP 130/80   Pulse (!) 116   Ht 5' 9.5 (1.765 m)   Wt 222 lb 12.8 oz (101.1 kg)   SpO2 94%   BMI 32.43 kg/m        Wt Readings from Last 3 Encounters:  01/09/24 222 lb 12.8 oz (101.1 kg)  12/20/23 227 lb (103 kg)  12/12/23 227 lb (103 kg)    Constitutional:      Appearance: Healthy  appearance. Not in distress.  Neck:     Vascular: JVD normal.  Pulmonary:     Breath sounds: Normal breath sounds. No wheezing. No rales.  Cardiovascular:     Tachycardia present. Regular rhythm.     Murmurs: There is no murmur.  Edema:    Peripheral edema absent.  Abdominal:     Palpations: Abdomen is soft.       Assessment and Plan:    Assessment & Plan Coronary artery disease involving native coronary artery of native heart without angina pectoris History of remote stenting to the LAD in the past. Recent admission in late July 2025 with inferior STEMI treated with DES to the RCA. He had distal LAD and apical LAD stenosis not amenable to PCI and managed medically. He developed cardiogenic shock after catheterization requiring vasopressors. GDMT was limited due to low blood pressures. Metoprolol  tartrate 12.5 mg twice daily was added at last visit.  His heart rate remains elevated.  His blood pressure is definitely increased and should be able to tolerate some of his previous medications.  Since his heart rate is elevated, I recommended placing him back on metoprolol  succinate 100 mg daily.  He has been able to get ticagrelor  without any issues.  He has completed home  health physical therapy.  He is interested in cardiac rehab. -Continue aspirin  81 mg daily, Brilinta  90 mg twice daily, Lipitor 80 mg daily -Stop metoprolol  tartrate -Resume metoprolol  succinate 100 mg daily -Okay to start cardiac rehab -Follow-up 4 months Primary hypertension Blood pressure is increased since his hospitalization.  As noted, stop metoprolol  tartrate and resume metoprolol  succinate 100 mg daily.  Continue to hold off on hydrochlorothiazide and lisinopril.  I have asked him to try to get a blood pressure machine and notify me what his blood pressure is running.  If above target, we can resume his ACE inhibitor. Pure hypercholesterolemia Recent LDL optimal at 47.  Continue Lipitor 80 mg daily      Cardiac  Rehabilitation Eligibility Assessment  The patient is ready to start cardiac rehabilitation from a cardiac standpoint. The patient is currently in a SNF or HHPT program.     Dispo:  Return in about 4 months (around 05/10/2024) for Routine Follow Up, w/ Dr. Verlin, or Glendia Ferrier, PA-C.  Signed, Glendia Ferrier, PA-C

## 2024-01-09 NOTE — Patient Instructions (Signed)
 Medication Instructions:  Your physician has recommended you make the following change in your medication:   STOP Metoprolol  tartrate and restart Toprol  Xl 100 mg taking 1 daily  *If you need a refill on your cardiac medications before your next appointment, please call your pharmacy*  Lab Work: None ordered  If you have labs (blood work) drawn today and your tests are completely normal, you will receive your results only by: MyChart Message (if you have MyChart) OR A paper copy in the mail If you have any lab test that is abnormal or we need to change your treatment, we will call you to review the results.  Testing/Procedures: None ordered  Follow-Up: At [facility], you and your health needs are our priority.  As part of our continuing mission to provide you with exceptional heart care, our providers are all part of one team.  This team includes your primary Cardiologist (physician) and Advanced Practice Providers or APPs (Physician Assistants and Nurse Practitioners) who all work together to provide you with the care you need, when you need it.  Your next appointment:   4 month(s)  Provider:   Lonni Cash, MD or Glendia Ferrier, PA-C          We recommend signing up for the patient portal called MyChart.  Sign up information is provided on this After Visit Summary.  MyChart is used to connect with patients for Virtual Visits (Telemedicine).  Patients are able to view lab/test results, encounter notes, upcoming appointments, etc.  Non-urgent messages can be sent to your provider as well.   To learn more about what you can do with MyChart, go to [email or web address].   Other Instructions

## 2024-01-09 NOTE — Assessment & Plan Note (Addendum)
 History of remote stenting to the LAD in the past. Recent admission in late July 2025 with inferior STEMI treated with DES to the RCA. He had distal LAD and apical LAD stenosis not amenable to PCI and managed medically. He developed cardiogenic shock after catheterization requiring vasopressors. GDMT was limited due to low blood pressures. Metoprolol  tartrate 12.5 mg twice daily was added at last visit.  His heart rate remains elevated.  His blood pressure is definitely increased and should be able to tolerate some of his previous medications.  Since his heart rate is elevated, I recommended placing him back on metoprolol  succinate 100 mg daily.  He has been able to get ticagrelor  without any issues.  He has completed home health physical therapy.  He is interested in cardiac rehab. -Continue aspirin  81 mg daily, Brilinta  90 mg twice daily, Lipitor 80 mg daily -Stop metoprolol  tartrate -Resume metoprolol  succinate 100 mg daily -Okay to start cardiac rehab -Follow-up 4 months

## 2024-01-09 NOTE — Assessment & Plan Note (Addendum)
 Recent LDL optimal at 47.  Continue Lipitor 80 mg daily

## 2024-01-09 NOTE — Assessment & Plan Note (Signed)
 Blood pressure is increased since his hospitalization.  As noted, stop metoprolol  tartrate and resume metoprolol  succinate 100 mg daily.  Continue to hold off on hydrochlorothiazide and lisinopril.  I have asked him to try to get a blood pressure machine and notify me what his blood pressure is running.  If above target, we can resume his ACE inhibitor.

## 2024-01-22 ENCOUNTER — Other Ambulatory Visit (HOSPITAL_COMMUNITY): Payer: Self-pay | Admitting: Cardiology

## 2024-01-22 ENCOUNTER — Other Ambulatory Visit (HOSPITAL_COMMUNITY): Payer: Self-pay

## 2024-01-22 ENCOUNTER — Other Ambulatory Visit: Payer: Self-pay

## 2024-01-24 ENCOUNTER — Other Ambulatory Visit (HOSPITAL_COMMUNITY): Payer: Self-pay

## 2024-01-24 ENCOUNTER — Other Ambulatory Visit (HOSPITAL_COMMUNITY): Payer: Self-pay | Admitting: Cardiology

## 2024-01-24 ENCOUNTER — Other Ambulatory Visit: Payer: Self-pay

## 2024-01-25 ENCOUNTER — Other Ambulatory Visit (HOSPITAL_COMMUNITY): Payer: Self-pay

## 2024-01-25 MED ORDER — DAPAGLIFLOZIN PROPANEDIOL 10 MG PO TABS
10.0000 mg | ORAL_TABLET | Freq: Every day | ORAL | 3 refills | Status: AC
  Filled 2024-01-25: qty 90, 90d supply, fill #0
  Filled 2024-05-08: qty 90, 90d supply, fill #1

## 2024-02-27 ENCOUNTER — Other Ambulatory Visit (HOSPITAL_COMMUNITY): Payer: Self-pay

## 2024-03-21 ENCOUNTER — Ambulatory Visit: Admitting: Podiatry

## 2024-04-15 ENCOUNTER — Other Ambulatory Visit (HOSPITAL_COMMUNITY): Payer: Self-pay

## 2024-04-28 ENCOUNTER — Encounter: Payer: Self-pay | Admitting: Physician Assistant

## 2024-04-28 ENCOUNTER — Ambulatory Visit: Attending: Physician Assistant | Admitting: Physician Assistant

## 2024-04-28 VITALS — BP 136/64 | HR 88 | Ht 69.5 in | Wt 217.8 lb

## 2024-04-28 DIAGNOSIS — I10 Essential (primary) hypertension: Secondary | ICD-10-CM

## 2024-04-28 DIAGNOSIS — R2681 Unsteadiness on feet: Secondary | ICD-10-CM

## 2024-04-28 DIAGNOSIS — I2510 Atherosclerotic heart disease of native coronary artery without angina pectoris: Secondary | ICD-10-CM

## 2024-04-28 DIAGNOSIS — E785 Hyperlipidemia, unspecified: Secondary | ICD-10-CM

## 2024-04-28 NOTE — Assessment & Plan Note (Signed)
 BP well-controlled in clinic today  They're not checking at home Orthostatic vital signs negative for orthostatic changes in clinic today Lying 115/71   HR 71;  sitting 108/73 HR 75;  standing 114/71  HR 89;  standing (3 minutes) 135/87   HR 91 Continue Toprol -XL 100 mg daily Hesitant to increase BP medications w/ history of unsteadiness with positional change and lack of consistent BP readings at home Will advise patient to keep BP log at home

## 2024-04-28 NOTE — Progress Notes (Unsigned)
 Cardiology Office Note:    Date:  04/28/2024   ID:  Richard Reid, DOB 1946-01-25, MRN [id number]  PCP:  Luvenia Badder, NP   [facility] Providers Cardiologist:  Lonni Cash, MD Cardiology APP:  Lelon Glendia DASEN, PA-C { Click to update primary MD,subspecialty MD or APP then REFRESH:1}    Referring MD: Luvenia Badder, NP   Chief complaint: 76-month follow-up     History of Present Illness:   Richard Reid is a 78 y.o. male with a hx of CAD, STEMI, HTN, HLD, T2DM, presenting today for 91-month follow-up of chronic cardiac conditions.  Patient has a remote history of PCI w/ DES placement to distal LAD.  Presented to the ED 11/16/2023 c/o CP, SOB, EKG with STEMI.  LHC revealing ostial-proximal RCA 99% stenosed, DES placed.  Distal LAD stent showing diffuse restenosis leading to the apical LAD, not a favorable target for stenting.  Nonobstructive disease present in the LCx.  LVEDP was 18 mmHg.  DAPT with ASA and Brilinta  recommended. He developed cardiogenic shock after catheterization requiring vasopressors. GDMT was limited due to low blood pressures.  Echo is revealing LVEF 55%, endocardial border not optimally defined to evaluate RWM.  Indeterminate diastolic parameters.  Low normal RV function.  Valvular function not well-visualized on complete or limited echo due to suboptimal apical window and suboptimal parasternal window.  Discharged on ASA, Brilinta , atorvastatin  80 mg daily.  Metoprolol , lisinopril, hydrochlorothiazide all held secondary to low normal BPs.  Follow-up with Glendia Lelon on 12/12/2023, where he reported DOE, did not appear to be volume overloaded, metoprolol  was restarted, was noted to have visible bugs crawling on him at this visit and review of chart indicated that he had bedbugs during his hospital stay.  Most recent OV with Glendia Lelon on 01/09/2024, heart rate appeared to be elevated, metoprolol  was increased to Toprol -XL 100 mg daily.  Was  cleared to participate in cardiac rehab.  Presents with his wife to clinic today, appears stable from a cardiovascular standpoint. He denies chest pain, palpitations, dyspnea, orthopnea, n, v,  dark/tarry/bloody stools, hematuria, dizziness, syncope, edema, weight gain. Patient is very hard of hearing. The patient and his wife report that he has mostly been sitting at home, not doing much.  Patient states he does not feel like doing much and gets off balance easily when going from sitting to standing.  States when he is walking, he does not have as many problems with balance.  Uses a cane to help stabilize himself.  States he does not feel dizzy or near syncopal, just wobbly.  Reports physical therapy came to his house multiple times and performed exercises with him, he has not continued to perform his exercises.  He did not attend cardiac rehab. States he's able to go grocery shopping as long as he has something to stabilize himself. They have a BP cuff at home, unsure of it's location. Of note, hygiene appears inadequate; clothing is dirty and emits odor. His wife states he often misses his night-time dose of Brilinta , even when she reminds him to take it, she appears visibly upset when discussing this.     Past Medical History:  Diagnosis Date   Coronary artery disease    LHC 12/2000:  mLAD stent ok, dLAD 95% at apex, very small intermediate 95% at origin, CFX < 20%, dRCA < 20%, EF 65%.  -  med Rx   Glucose intolerance (impaired glucose tolerance)    Hyperlipidemia    Hypertension  Obesity     Past Surgical History:  Procedure Laterality Date   CARDIAC CATHETERIZATION     CORONARY/GRAFT ACUTE MI REVASCULARIZATION N/A 11/16/2023   Procedure: Coronary/Graft Acute MI Revascularization;  Surgeon: Verlin Lonni BIRCH, MD;  Location: [facility] INVASIVE [facility];  Service: Cardiovascular;  Laterality: N/A;   RIGHT/LEFT HEART CATH AND CORONARY ANGIOGRAPHY N/A 11/16/2023   Procedure: RIGHT/LEFT HEART CATH  AND CORONARY ANGIOGRAPHY;  Surgeon: Verlin Lonni BIRCH, MD;  Location: [facility] INVASIVE CV [facility];  Service: Cardiovascular;  Laterality: N/A;   STENT TO  MID LAD       Current Medications: Active Medications[1]   Allergies:   Morphine and codeine   Social History   Socioeconomic History   Marital status: Married    Spouse name: Not on file   Number of children: Not on file   Years of education: Not on file   Highest education level: Not on file  Occupational History   Not on file  Tobacco Use   Smoking status: Former   Smokeless tobacco: Current    Types: Snuff, Chew  Substance and Sexual Activity   Alcohol use: No   Drug use: Not Currently   Sexual activity: Not on file  Other Topics Concern   Not on file  Social History Narrative   Not on file   Social Drivers of Health   Tobacco Use: High Risk (04/28/2024)   Patient History    Smoking Tobacco Use: Former    Smokeless Tobacco Use: Current    Passive Exposure: Not on Actuary Strain: Not on file  Food Insecurity: No Food Insecurity (11/17/2023)   Epic    Worried About Radiation Protection Practitioner of Food in the Last Year: Never true    Ran Out of Food in the Last Year: Never true  Transportation Needs: No Transportation Needs (11/17/2023)   Epic    Lack of Transportation (Medical): No    Lack of Transportation (Non-Medical): No  Physical Activity: Not on file  Stress: Not on file  Social Connections: Moderately Integrated (11/17/2023)   Social Connection and Isolation Panel    Frequency of Communication with Friends and Family: Once a week    Frequency of Social Gatherings with Friends and Family: Once a week    Attends Religious Services: 1 to 4 times per year    Active Member of [employer] or Organizations: No    Attends Engineer, Structural: 1 to 4 times per year    Marital Status: Married  Depression (EYV7-0): Not on file  Alcohol Screen: Not on file  Housing: Unknown (11/17/2023)   Epic    Unable to Pay  for Housing in the Last Year: No    Number of Times Moved in the Last Year: Not on file    Homeless in the Last Year: No  Utilities: Not At Risk (11/17/2023)   Epic    Threatened with loss of utilities: No  Health Literacy: Not on file     Family History: The patient's family history includes Heart disease in his father; Heart failure in his father.  EKGs/Labs/Other Studies Reviewed:    The following studies were reviewed today:       Recent Labs: 11/16/2023: TSH 3.826 11/18/2023: Magnesium  2.0 11/19/2023: Hemoglobin 13.4; Platelets 203 12/12/2023: ALT 14; BUN 12; Creatinine, Ser 1.05; NT-Pro BNP 263; Potassium 4.5; Sodium 140  Recent Lipid Panel    Component Value Date/Time   CHOL 139 11/16/2023 0812   TRIG 248 (H) 11/16/2023  9187   HDL 30 (L) 11/16/2023 0812   CHOLHDL 4.6 11/16/2023 0812   VLDL 50 (H) 11/16/2023 0812   LDLCALC 59 11/16/2023 0812   LDLDIRECT 47 12/12/2023 1535              Physical Exam:    VS:  BP 136/64 (BP Location: Left Arm, Patient Position: Sitting, Cuff Size: Large)   Pulse 88   Ht 5' 9.5 (1.765 m)   Wt 217 lb 12.8 oz (98.8 kg)   SpO2 93%   BMI 31.70 kg/m        Wt Readings from Last 3 Encounters:  04/28/24 217 lb 12.8 oz (98.8 kg)  01/09/24 222 lb 12.8 oz (101.1 kg)  12/20/23 227 lb (103 kg)     GEN:  Well nourished, well developed, in no acute distress HEENT: Normal NECK:  No carotid bruits CARDIAC:  S1-S2 normal, RRR, no murmurs, rubs, gallops RESPIRATORY:  Clear to auscultation without rales, wheezing or rhonchi  MUSCULOSKELETAL:  No edema; No deformity  SKIN: Warm and dry NEUROLOGIC:  Alert and oriented x 3 PSYCHIATRIC:  Normal affect    ROS:   Please see the history of present illness.     All other systems reviewed and are negative.     Assessment & Plan Unsteadiness on feet Patient is able to perform activities like grocery shopping when necessary, states he doesn't like to do much after issues with his balance over  the last few months. No visualized difficulties with gait/stumbling/unsteadiness on walk in or out of clinic. No gross motor deficits. LVEF 60-65% following his event in July.  Suspect his activity intolerance may be at least partially secondary to deconditioning. PT came to patient's house, he performed activities when monitored, has not continued exercises. Advised patient to follow up with PCP on balance issues.  Coronary artery disease involving native coronary artery of native heart without angina pectoris LHC 11/16/2023: DES placed in subtotally occluded ostial-proximal RCA The patient is without symptoms of angina, however, leads a relatively in-active lifestyle. Wife admits he is occasionally not taking his night-time dose of Brilinta , she appears visibly upset. States she will remind him to take it, and when she checks the next day it's still there. I re-enforced the significance of taking the antiplatelet as well as the consequences for not doing so. Denies any bleeding/bruising. Patient reports he simply forgets to take it. Continue to monitor for new cardiovascular symptoms. Continue aspirin  EC 81 mg daily Continue Lipitor 80 mg daily Continue Toprol -XL 100 mg daily Continue nitroglycerin  0.4 mg SL tab PRN chest pain every 5 minutes Continue Brilinta  90 mg twice daily ED precautions given Primary hypertension BP well-controlled in clinic today  They're not checking at home Orthostatic vital signs negative for orthostatic changes in clinic today Lying 115/71   HR 71;  sitting 108/73 HR 75;  standing 114/71  HR 89;  standing (3 minutes) 135/87   HR 91 Continue Toprol -XL 100 mg daily Hesitant to increase BP medications w/ history of unsteadiness with positional change and lack of consistent BP readings at home Will advise patient to keep BP log at home Hyperlipidemia, unspecified hyperlipidemia type Lipid panel 11/16/2023: Cholesterol 139, HDL 30, LDL 59, triglycerides 248 Continue  atorvastatin  80 mg daily  Disposition: Follow-up with primary cardiologist in 6 months *** Route to primary cardiologist           Medication Adjustments/Labs and Tests Ordered: Current medicines are reviewed at length with the patient today.  Concerns regarding medicines are outlined above.  No orders of the defined types were placed in this encounter.  No orders of the defined types were placed in this encounter.   Patient Instructions  Medication Instructions:  NO CHANGES *If you need a refill on your cardiac medications before your next appointment, please call your pharmacy*  Lab Work: NO LABS If you have labs (blood work) drawn today and your tests are completely normal, you will receive your results only by: MyChart Message (if you have MyChart) OR A paper copy in the mail If you have any lab test that is abnormal or we need to change your treatment, we will call you to review the results.  Testing/Procedures: NO TESTING  Follow-Up: At [facility], you and your health needs are our priority.  As part of our continuing mission to provide you with exceptional heart care, our providers are all part of one team.  This team includes your primary Cardiologist (physician) and Advanced Practice Providers or APPs (Physician Assistants and Nurse Practitioners) who all work together to provide you with the care you need, when you need it.  Your next appointment:   6 month(s)  Provider:   Lonni Cash, MD    We recommend signing up for the patient portal called MyChart.  Sign up information is provided on this After Visit Summary.  MyChart is used to connect with patients for Virtual Visits (Telemedicine).  Patients are able to view lab/test results, encounter notes, upcoming appointments, etc.  Non-urgent messages can be sent to your provider as well.   To learn more about what you can do with MyChart, go to [email or web address].   Other  Instructions INCREASE PHYSICAL ACTIVITY AS TOLERATED.  FOLLOW UP WITH PCP REGARDING FATIGUE.    Signed, Miriam FORBES Shams, NP  04/28/2024 8:23 PM    [facility]     [1]  Current Meds  Medication Sig   aspirin  EC 81 MG tablet Take 1 tablet (81 mg total) by mouth daily. Swallow whole.   atorvastatin  (LIPITOR) 80 MG tablet Take 1 tablet (80 mg total) by mouth daily.   Blood Glucose Monitoring Suppl (ONE TOUCH ULTRA 2) w/Device KIT 2 (two) times daily.   dapagliflozin  propanediol (FARXIGA ) 10 MG TABS tablet Take 1 tablet (10 mg total) by mouth daily before breakfast.   dapagliflozin  propanediol (FARXIGA ) 10 MG TABS tablet Take 1 tablet (10 mg total) by mouth daily with breakfast.   levothyroxine  (SYNTHROID ) 25 MCG tablet Take 25 mcg by mouth daily.   metFORMIN (GLUCOPHAGE) 1000 MG tablet Take 1,000 mg by mouth 2 (two) times daily.   metoprolol  succinate (TOPROL -XL) 100 MG 24 hr tablet Take 1 tablet (100 mg total) by mouth daily. Take with or immediately following a meal.   nitroGLYCERIN  (NITROSTAT ) 0.4 MG SL tablet Place 1 tablet (0.4 mg total) under the tongue every 5 (five) minutes as needed for chest pain.   nitroGLYCERIN  (NITROSTAT ) 0.4 MG SL tablet Place 1 tablet under the tongue every 5 minutes as needed for chest pain for up to 3 doses.   ondansetron  (ZOFRAN -ODT) 4 MG disintegrating tablet Take 1 tablet (4 mg total) by mouth every 8 (eight) hours as needed.   ONETOUCH ULTRA TEST test strip 2 (two) times daily.   OneTouch UltraSoft 2 Lancets MISC 2 (two) times daily.   ticagrelor  (BRILINTA ) 90 MG TABS tablet Take 1 tablet (90 mg total) by mouth 2 (two) times daily.

## 2024-04-28 NOTE — Assessment & Plan Note (Signed)
 Lipid panel 11/16/2023: Cholesterol 139, HDL 30, LDL 59, triglycerides 248 Continue atorvastatin  80 mg daily

## 2024-04-28 NOTE — Assessment & Plan Note (Signed)
 LHC 11/16/2023: DES placed in subtotally occluded ostial-proximal RCA The patient is without symptoms of angina, however, leads a relatively in-active lifestyle. Wife admits he is occasionally not taking his night-time dose of Brilinta , she appears visibly upset. States she will remind him to take it, and when she checks the next day it's still there. I re-enforced the significance of taking the antiplatelet as well as the consequences for not doing so. Denies any bleeding/bruising. Patient reports he simply forgets to take it. Continue to monitor for new cardiovascular symptoms. Continue aspirin  EC 81 mg daily Continue Lipitor 80 mg daily Continue Toprol -XL 100 mg daily Continue nitroglycerin  0.4 mg SL tab PRN chest pain every 5 minutes Continue Brilinta  90 mg twice daily ED precautions given

## 2024-04-28 NOTE — Patient Instructions (Addendum)
 Medication Instructions:  NO CHANGES *If you need a refill on your cardiac medications before your next appointment, please call your pharmacy*  Lab Work: NO LABS If you have labs (blood work) drawn today and your tests are completely normal, you will receive your results only by: MyChart Message (if you have MyChart) OR A paper copy in the mail If you have any lab test that is abnormal or we need to change your treatment, we will call you to review the results.  Testing/Procedures: NO TESTING  Follow-Up: At [facility], you and your health needs are our priority.  As part of our continuing mission to provide you with exceptional heart care, our providers are all part of one team.  This team includes your primary Cardiologist (physician) and Advanced Practice Providers or APPs (Physician Assistants and Nurse Practitioners) who all work together to provide you with the care you need, when you need it.  Your next appointment:   6 month(s)  Provider:   Lonni Cash, MD    We recommend signing up for the patient portal called MyChart.  Sign up information is provided on this After Visit Summary.  MyChart is used to connect with patients for Virtual Visits (Telemedicine).  Patients are able to view lab/test results, encounter notes, upcoming appointments, etc.  Non-urgent messages can be sent to your provider as well.   To learn more about what you can do with MyChart, go to [email or web address].   Other Instructions INCREASE PHYSICAL ACTIVITY AS TOLERATED.  FOLLOW UP WITH PCP REGARDING FATIGUE.

## 2024-04-29 ENCOUNTER — Ambulatory Visit: Admitting: Cardiovascular Disease

## 2024-04-29 ENCOUNTER — Other Ambulatory Visit (HOSPITAL_COMMUNITY): Payer: Self-pay

## 2024-04-29 ENCOUNTER — Telehealth: Payer: Self-pay | Admitting: Emergency Medicine

## 2024-04-29 MED ORDER — CLOPIDOGREL BISULFATE 75 MG PO TABS
300.0000 mg | ORAL_TABLET | Freq: Once | ORAL | 0 refills | Status: DC
  Filled 2024-04-29: qty 4, 1d supply, fill #0

## 2024-04-29 MED ORDER — CLOPIDOGREL BISULFATE 75 MG PO TABS
75.0000 mg | ORAL_TABLET | Freq: Every day | ORAL | 4 refills | Status: AC
  Filled 2024-04-29: qty 90, 90d supply, fill #0

## 2024-04-29 MED ORDER — CLOPIDOGREL BISULFATE 75 MG PO TABS
ORAL_TABLET | ORAL | 0 refills | Status: AC
  Filled 2024-04-29: qty 90, 90d supply, fill #0

## 2024-04-29 NOTE — Telephone Encounter (Signed)
 Discussed case further with patient's primary cardiologist, Dr. Verlin, who prefers patient switch to once daily dosing of plavix  to avoid missing any further doses of brilinta . Discussed the switch with patient and wife over the phone, who are agreeable to plan. Will send in new dose of plavix  to pharmacy.

## 2024-04-29 NOTE — Addendum Note (Signed)
 Addended by: Kenneth Cuaresma E on: 04/29/2024 03:17 PM   Modules accepted: Orders

## 2024-05-14 ENCOUNTER — Other Ambulatory Visit (HOSPITAL_COMMUNITY): Payer: Self-pay
